# Patient Record
Sex: Male | Born: 1970 | Race: White | Hispanic: No | Marital: Married | Smoking: Former smoker
Health system: Southern US, Community
[De-identification: ages and names within clinical notes are randomized; demographics above are authoritative.]

## PROBLEM LIST (undated history)

## (undated) DIAGNOSIS — Z87442 Personal history of urinary calculi: Secondary | ICD-10-CM

## (undated) DIAGNOSIS — N529 Male erectile dysfunction, unspecified: Secondary | ICD-10-CM

## (undated) DIAGNOSIS — E785 Hyperlipidemia, unspecified: Secondary | ICD-10-CM

## (undated) DIAGNOSIS — N200 Calculus of kidney: Secondary | ICD-10-CM

## (undated) DIAGNOSIS — M4800 Spinal stenosis, site unspecified: Secondary | ICD-10-CM

## (undated) DIAGNOSIS — E119 Type 2 diabetes mellitus without complications: Secondary | ICD-10-CM

## (undated) DIAGNOSIS — I10 Essential (primary) hypertension: Secondary | ICD-10-CM

## (undated) HISTORY — PX: HAND SURGERY: SHX662

## (undated) HISTORY — DX: Male erectile dysfunction, unspecified: N52.9

## (undated) HISTORY — PX: APPENDECTOMY: SHX54

## (undated) HISTORY — DX: Essential (primary) hypertension: I10

## (undated) HISTORY — DX: Hyperlipidemia, unspecified: E78.5

## (undated) HISTORY — PX: KIDNEY STONE SURGERY: SHX686

## (undated) HISTORY — DX: Type 2 diabetes mellitus without complications: E11.9

---

## 2004-02-01 HISTORY — PX: LASIK: SHX215

## 2005-05-02 ENCOUNTER — Ambulatory Visit: Payer: Self-pay | Admitting: Family Medicine

## 2010-07-20 ENCOUNTER — Ambulatory Visit (HOSPITAL_BASED_OUTPATIENT_CLINIC_OR_DEPARTMENT_OTHER)
Admission: RE | Admit: 2010-07-20 | Discharge: 2010-07-20 | Disposition: A | Discharge status: Home / Self Care | Payer: Worker's Compensation | Source: Ambulatory Visit | Attending: Orthopedic Surgery | Admitting: Orthopedic Surgery

## 2010-07-20 DIAGNOSIS — Z794 Long term (current) use of insulin: Secondary | ICD-10-CM | POA: Insufficient documentation

## 2010-07-20 DIAGNOSIS — Z1811 Retained magnetic metal fragments: Secondary | ICD-10-CM | POA: Insufficient documentation

## 2010-07-20 DIAGNOSIS — E119 Type 2 diabetes mellitus without complications: Secondary | ICD-10-CM | POA: Insufficient documentation

## 2010-07-20 DIAGNOSIS — S61209A Unspecified open wound of unspecified finger without damage to nail, initial encounter: Secondary | ICD-10-CM | POA: Insufficient documentation

## 2010-07-20 DIAGNOSIS — Y9269 Other specified industrial and construction area as the place of occurrence of the external cause: Secondary | ICD-10-CM | POA: Insufficient documentation

## 2010-07-20 DIAGNOSIS — W268XXA Contact with other sharp object(s), not elsewhere classified, initial encounter: Secondary | ICD-10-CM | POA: Insufficient documentation

## 2010-07-20 DIAGNOSIS — F17200 Nicotine dependence, unspecified, uncomplicated: Secondary | ICD-10-CM | POA: Insufficient documentation

## 2010-07-20 DIAGNOSIS — Y990 Civilian activity done for income or pay: Secondary | ICD-10-CM | POA: Insufficient documentation

## 2010-07-20 LAB — POCT I-STAT, CHEM 8
BUN: 16 mg/dL (ref 6–23)
Calcium, Ion: 1.23 mmol/L (ref 1.12–1.32)
Chloride: 109 mEq/L (ref 96–112)
Creatinine, Ser: 1 mg/dL (ref 0.50–1.35)
Glucose, Bld: 141 mg/dL — ABNORMAL HIGH (ref 70–99)
TCO2: 22 mmol/L (ref 0–100)

## 2010-08-02 NOTE — H&P (Signed)
NAME:  Johnathan Potts, Johnathan Potts NO.:  [id number]  MEDICAL RECORD NO.:  [id number]  LOCATION:                                 FACILITY:  PHYSICIAN:  Betha Loa, MD             DATE OF BIRTH:  DATE OF ADMISSION:  07/20/2010 DATE OF DISCHARGE:                             HISTORY & PHYSICAL   CHIEF COMPLAINT:  Foreign body, left long finger MP joint.  HISTORY:  Mr. 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  He states earlier today while he was working on exhaust system, a piece of metal mass poked into his left long finger on the dorsum of the hand.  His hand was in a fist at that time.  He initially had difficulty moving his finger, but since unable to get it moving.  He noted a small wound on the dorsum of the finger. He presented to Urgent Care Facility where radiographs were taken revealing a small radiopaque foreign body in the MP joint of the long finger.  I was consulted for management of injury.  He reports no other injuries at this time.  No previous injuries.  ALLERGIES:  No known drug allergies.  PAST MEDICAL HISTORY:  Diabetes, hypertension, hypercholesterolemia.  PAST SURGICAL HISTORY:  Appendectomy and kidney stone, laser surgery.  MEDICATIONS:  Metformin, Lantus, simvastatin, fenofibrate, and lisinopril.  SOCIAL HISTORY:  Mr. Mutschler works as a Curator for [employer].  He smokes less than one pack per day x20 years and drinks approximately 2 shots per day.  FAMILY HISTORY:  Positive for diabetes.  REVIEW OF SYSTEMS:  A 13-point review of systems is negative with the exception of having recent cold.  PHYSICAL EXAMINATION:  GENERAL:  Alert and oriented x3, well developed, well nourished. HEAD:  Normocephalic, atraumatic. NECK:  Supple full range of motion. CHEST:  Regular rate rhythm. LUNGS:  Clear to auscultation bilaterally. ABDOMEN:  Nontender, nondistended. EXTREMITIES:  Right upper  extremity is intact to sensation and capillary refill in all fingertips.  He can flex and extend the IP joint of thumb and cross his fingers.  He has full range of motion.  No tenderness to palpation.  The left upper extremity, he has intact sensation and capillary refill in all fingertips.  He can flex and extend the IP joint of thumb and cross his fingers.  He can fully extend the digits.  When making a fist, he has discomfort in the dorsum of the long finger.  There is a small poke wound distal to the MP joint.  There is no erythema.  No drainage.  No swelling.  No ecchymosis.  He is tender to palpation around the poke wound and near the MP joint.  He has grease on his hands.  RADIOGRAPHS:  AP, lateral, and oblique view was taken at Urgent Care showing small, thin radiopaque foreign body that appears to be within the MP joint of the long finger.  No other fractures, dislocations, or radiopaque foreign bodies are noted.  ASSESSMENT AND PLAN:  Left long finger metacarpophalangeal joint, foreign body.  I  discussed with Mr. Bard the nature of his injury.  I recommended going to the operating room for removal of the foreign body from the joint, irrigation and debridement of the joint.  The risks, benefits, and alternatives of surgery were discussed including risk of blood loss, infection, damage to nerves, vessels, tendons, ligaments, bone, failure of procedure, need for additional procedure, complications with wound healing, continued pain, potential infection and need for repeat irrigation and debridement.  He voiced understanding of these risks and elected to proceed.  We will have the surgery arranged for as soon as the room is available.     Betha Loa, MD     KK/MEDQ  D:  07/20/2010  T:  07/21/2010  Job:  [id number]  Electronically Signed by Betha Loa  on 08/02/2010 05:27:37 PM

## 2010-08-02 NOTE — Op Note (Signed)
NAME:  Johnathan Potts, Johnathan Potts NO.:  [id number]  MEDICAL RECORD NO.:  [id number]  LOCATION:                                 FACILITY:  PHYSICIAN:  Betha Loa, MD        DATE OF BIRTH:  Jan 17, 1971  DATE OF PROCEDURE:  07/20/2010 DATE OF DISCHARGE:                              OPERATIVE REPORT   PREOPERATIVE DIAGNOSIS:  Left long finger MP joint, foreign body.  POSTOPERATIVE DIAGNOSIS:  Left long finger MP joint, foreign body.  PROCEDURE:  Left long finger MP joint removal foreign body and irrigation debridement of joint.  SURGEON:  Betha Loa, MD.  ASSISTANTS:  None.  ANESTHESIA:  General.  INTRAVENOUS FLUIDS:  As per anesthesia flow sheet.  ESTIMATED BLOOD LOSS:  Minimal.  COMPLICATIONS:  None.  SPECIMENS:  None.  TOURNIQUET TIME:  Thirty-two minutes.  DISPOSITION:  Stable to PACU.  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  He had pain and inability to range of motion his finger very well initially.  He was then able to move his finger. He went to an urgent care facility, where radiographs were taken revealing a radiopaque foreign body in the MP joint.  I was consulted for management of injury.  On examination, he had intact sensation capillary refill in the fingertips.  There was a small poke wound on the dorsum of the long finger.  There is no erythema, swelling, drainage or ecchymosis.  He was able to perform range of motion of the finger.  On radiographs, he had a small radiopaque foreign body in the MP joint of the left long finger.  No other fractures, dislocations or radiopaque foreign bodies were noted.  I recommended Johnathan Potts going to the operating room for irrigation debridement of the joint and removal of the foreign body.  Risks, benefits,  alternative of the surgery were discussed including risk of blood loss, infection, damage to nerves, vessels, tendons, ligaments bone, fair surgery, need for additional surgery, complications with wound healing, continued pain, retained foreign body and infection.  He voices understanding his risks and elected to proceed.  His tetanus has been updated within the last 2 years he states.  OPERATIVE COURSE:  After being identified preoperatively by myself, the patient and I agreed upon procedure and site of the procedure.  The surgery site was marked.  The risks, benefits and alternatives of the surgery were reviewed and he wished to proceed.  Surgical consent had been signed.  He was transferred to the operating room, placed in the operating table in supine position with the left upper extremity on arm board.  General anesthesia was induced by the anesthesiologist.  Left upper extremity prepped and draped in normal sterile orthopedic fashion. A surgical pause was performed between surgeons, Anesthesia and operating room staff and all were in agreement with  the patient's procedure and site of the procedure.  Tourniquet at the proximal aspect of the extremity  was inflated to 250 mmHg after exsanguination of limb with an Esmarch bandage.  Incision was made dorsally over the MP joint long finger.  This carried into subcutaneous tissues.  The metallic foreign body was noted at the ulnar side of the tendon piercing the sagittal bands.  It was removed.  There was some remaining gross contamination that appeared like small bits of metal.  These were debrided with the pickups and RayTec sponge.  The wound was copiously irrigated with sterile saline.  A rent was made in the ulnar side of the extensor mechanism proximal to the sagittal bands.  The undersurface of the tendon was explored.  There was small bits of metal shaving and here as well. These were removed again using the pickups and the  Ray-Tec sponge.  This area was irrigated.  The capsule was incised and the joint entered. There was no gross contamination within the joint.  I did not see any damage to the articular surface from the foreign body.  Looks distally at the ulnar side of the proximal phalanx and did not see an area where the foreign body had pierced the bone.  The joint was copiously irrigated with 400 mL of sterile saline by angiocatheter.  The wound was again copiously irrigated with sterile saline  and all 800 mL of sterile saline was used.  Two vessel loop drains were placed in the joint to allow for any necessary drainage.  A 4-0 Mersilene was used to repair proximally and distally.  The rent made in the sagittal band while leaving enough space open for the drains to come through allowing drainage.  The skin was repaired using 4-0 nylon suture in a horizontal mattress fashion leaving the central portion open for the drains.  The wound was injected with 4 mL of 0.25% plain Marcaine to aid in postoperative analgesia.  The wound was then dressed with sterile Xeroform, 4x4s and wrapped with Kling and Coban dressing lightly.  A Alumafoam splint was placed with the MP joint flexed approximately 10 degrees in the PIP joint in extension.  This was wrapped with a Coban lightly.  The tourniquet is deflated at 33 minutes.  The fingertips were pink with brisk capillary refill after deflation of tourniquet. Operative drapes were broken down.  The patient was awoken from anesthesia safely.  Transferred back to stretcher and taken to PACU in stable condition.  I will see him back in the office in 3 days for postoperative followup.  We will give him Percocet 5/325 1-2 p.o. q.6 h. p.r.n. pain dispensed #14 and Bactrim DS one p.o. b.i.d. x14 days as he is diabetic.     Betha Loa, MD     KK/MEDQ  D:  07/20/2010  T:  07/21/2010  Job:  [id number]  Electronically Signed by Betha Loa  on 08/02/2010 05:26:08 PM

## 2019-02-26 NOTE — Progress Notes (Addendum)
[facility] - Clinic Note  03/11/2019     CHIEF COMPLAINT Patient presents for Diabetic Eye Exam   HISTORY OF PRESENT ILLNESS: Johnathan Potts is a 49 y.o. male who presents to the clinic today for:   HPI    Diabetic Eye Exam    Vision is blurred for near.  Associated Symptoms Floaters.  Negative for Flashes, Distortion, Blind Spot, Pain, Redness, Photophobia, Glare, Trauma, Scalp Tenderness, Jaw Claudication, Shoulder/Hip pain, Fever, Weight Loss and Fatigue.  Diabetes characteristics include Type 2.  This started 12 years ago.  Blood sugar level is controlled.  Last Blood Glucose: Hasn't checked recently.  Last A1C 6.2 (History of a1c 11, around 6-12 months ago).  I, the attending physician,  performed the HPI with the patient and updated documentation appropriately.          Comments    Patient referred by Dr. Blackwood for diabetic retinal evaluation. Patient states BS now under good control. Last a1c was 6.2, which was down from 11. A1c was 11 about 6-12 months ago. Has floaters OD. Floaters are not new. Patient denies flashes. Had LASIK OU at [facility] about 15 years ago.       Last edited by Zamora, Brian, MD on 03/11/2019  9:43 AM. (History)    pt is here on the referral of his PCP, Dr. Blackwood, for a DM exam, pts last A1c was 6.2, he states he had a Dexcom monitor with his last insurance and he was able to get his blood sugar under control pretty quickly, he states he is trying to get a new one though [employer], but he is having a hard time, pt had lasik with Dr. Stonecipher about 15 years ago  Referring physician: Blackwood, Hillary, NP [address] [place],  [place] [address]  HISTORICAL INFORMATION:   Selected notes from the medical record:  Diabetic Eval Referred by Dr. Blackwood   CURRENT MEDICATIONS: No current outpatient medications on file. (Ophthalmic Drugs)   No current facility-administered medications for this visit.  (Ophthalmic Drugs)   Current Outpatient Medications (Other)  Medication Sig  . betamethasone dipropionate 0.05 % cream Apply 1 application topically as needed.  . Blood Glucose Monitoring Suppl (ONE TOUCH ULTRA 2) w/Device KIT as directed.  . Continuous Blood Gluc Receiver (DEXCOM G6 RECEIVER) DEVI as directed.  . Continuous Blood Gluc Sensor (DEXCOM G6 SENSOR) MISC as directed.  . Continuous Blood Gluc Transmit (DEXCOM G6 TRANSMITTER) MISC as directed.  . cyclobenzaprine (FLEXERIL) 10 MG tablet Take 10 mg by mouth 3 (three) times daily as needed.  . fenofibrate 160 MG tablet Take 160 mg by mouth daily.  . insulin NPH Human (HUMULIN N) 100 UNIT/ML injection Inject 15 Units into the skin as needed.  . lisinopril (ZESTRIL) 20 MG tablet Take 20 mg by mouth 2 (two) times daily.  . magnesium oxide (MAG-OX) 400 MG tablet Take 2 tablets by mouth 2 (two) times daily.  . Melatonin 3 MG CAPS Take 3 mg by mouth at bedtime.  . meloxicam (MOBIC) 15 MG tablet Take 15 mg by mouth daily.  . metFORMIN (GLUCOPHAGE-XR) 750 MG 24 hr tablet Take 750 mg by mouth 2 (two) times daily.  . Multiple Vitamin (MULTI-VITAMIN) tablet Take 1 tablet by mouth daily.  . omeprazole (PRILOSEC OTC) 20 MG tablet Take 20 mg by mouth every other day.  . OZEMPIC, 0.25 OR 0.5 MG/DOSE, 2 MG/1.5ML SOPN Inject 1 Units into the skin once a week.  .   simvastatin (ZOCOR) 20 MG tablet Take 20 mg by mouth at bedtime.   No current facility-administered medications for this visit. (Other)      REVIEW OF SYSTEMS: ROS    Positive for: Endocrine, Eyes   Negative for: Constitutional, Gastrointestinal, Neurological, Skin, Genitourinary, Musculoskeletal, HENT, Cardiovascular, Respiratory, Psychiatric, Allergic/Imm, Heme/Lymph   Last edited by Barber, Daryl D, COT on 03/11/2019  8:27 AM. (History)       ALLERGIES No Known Allergies  PAST MEDICAL HISTORY Past Medical History:  Diagnosis Date  . Diabetes mellitus without complication  (HCC)   . Hypertension    Past Surgical History:  Procedure Laterality Date  . LASIK Bilateral 2006   [facility]    FAMILY HISTORY Family History  Problem Relation Age of Onset  . Diabetes Mother   . Diabetes Father   . Diabetes Brother     SOCIAL HISTORY Social History   Tobacco Use  . Smoking status: Current Every Day Smoker    Packs/day: 1.00    Years: 35.00    Pack years: 35.00    Types: Cigarettes  . Smokeless tobacco: Never Used  Substance Use Topics  . Alcohol use: Yes    Comment: social  . Drug use: Never         OPHTHALMIC EXAM:  Base Eye Exam    Visual Acuity (Snellen - Linear)      Right Left   Dist [place] 20/25 -1 20/30 -1   Dist ph [place] NI 20/20       Tonometry (Tonopen, 8:41 AM)      Right Left   Pressure 16 17       Pupils      Dark Light Shape React APD   Right 3 2 Round Brisk None   Left 3 2 Round Brisk None       Visual Fields (Counting fingers)      Left Right    Full Full       Extraocular Movement      Right Left    Full, Ortho Full, Ortho       Neuro/Psych    Oriented x3: Yes   Mood/Affect: Normal        Slit Lamp and Fundus Exam    Slit Lamp Exam      Right Left   Lids/Lashes Dermatochalasis - upper lid, Meibomian gland dysfunction Dermatochalasis - upper lid, Meibomian gland dysfunction   Conjunctiva/Sclera White and quiet White and quiet   Cornea Trace Punctate epithelial erosions, scattered focal scars, well healed lasik flap Trace Punctate epithelial erosions, well healed lasik flap   Anterior Chamber Deep and quiet Deep and quiet   Iris Round and dilated, No NVI Round and dilated, No NVI   Lens 1-2+ Nuclear sclerosis, 1-2+ Cortical cataract 1-2+ Nuclear sclerosis, 2+ Cortical cataract   Vitreous Dense Asteroid hyalosis clear       Fundus Exam      Right Left   Disc Pink and Sharp Pink and Sharp, mild tilt   C/D Ratio 0.2 0.3   Macula hazy view, grossly flat, scattered MA, +Cystic changes Flat,  Blunted foveal reflex, +Microaneurysms/DBH   Vessels Mild Vascular attenuation Vascular attenuation, mild Tortuousity, mild AV crossing changes   Periphery Attached, scattered 360 IRH/DBH greatest posteriorly, focal CWS nasal to disc Attached, scattered 360 IRH/DBH          Refraction    Manifest Refraction      Sphere Cylinder Axis Dist VA     Right [place] +0.50 020 20/30   Left -0.75 +0.50 082 20/25+2          IMAGING AND PROCEDURES  Imaging and Procedures for _0 @  OCT, Retina - OU - Both Eyes       Right Eye Quality was good. Central Foveal Thickness: 271. Progression has no prior data. Findings include abnormal foveal contour, intraretinal fluid, no SRF.   Left Eye Quality was good. Central Foveal Thickness: 267. Progression has no prior data. Findings include normal foveal contour, no IRF, no SRF.   Notes *Images captured and stored on drive  Diagnosis / Impression:  OD: abnormal foveal contour, +IRF, no SRF OS: NFP, no IRF/SRF  Clinical management:  See below  Abbreviations: NFP - Normal foveal profile. CME - cystoid macular edema. PED - pigment epithelial detachment. IRF - intraretinal fluid. SRF - subretinal fluid. EZ - ellipsoid zone. ERM - epiretinal membrane. ORA - outer retinal atrophy. ORT - outer retinal tubulation. SRHM - subretinal hyper-reflective material        Fluorescein Angiography Optos (Transit OD)       Right Eye   Progression has no prior data. Early phase findings include microaneurysm, blockage (Focal blockage from asteroid). Mid/Late phase findings include leakage (focal blockage from asteroid; late leaking MA 360).   Left Eye   Progression has no prior data. Early phase findings include microaneurysm. Mid/Late phase findings include microaneurysm, leakage.   Notes **Images stored on drive**  Impression: Moderate to severe NPDR OU Late leaking MA's OU No NV OU OD: scattered punctate blockage from asteroid hyalosis                   ASSESSMENT/PLAN:    ICD-10-CM   1. 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 Moderate nonproliferative diabetic retinopathy of left eye without macular edema associated with type 2 diabetes mellitus ([place])  U20.2542   3. Retinal edema  H35.81 OCT, Retina - OU - Both Eyes  4. Essential hypertension  I10   5. 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 Combined forms of age-related cataract of both eyes  H25.813     1-3. Moderate nonproliferative diabetic retinopathy OU  - OD w/ mild DME; OS without DME  - The incidence, risk factors for progression, natural history and treatment options for diabetic retinopathy were discussed with patient.    - The need for close monitoring of blood glucose, blood pressure, and serum lipids, avoiding cigarette or any type of tobacco, and the need for long term follow up was also discussed with patient.  - exam shows scattered MA and IRH/DBH OU  - FA (02.08.21) shows late leaking MA 360 OU; no NV OU  - OCT w/ mild IRF/DME OD, OS without DME  - discussed findings, prognosis  - no intervention recommended at this time -- recommend monitoring for now  - f/u in 2-3 mos -- DFE/OCT  4,5. Hypertensive retinopathy OU  - discussed importance of tight BP control  - monitor  6. Asteroid hyalosis OD  - discussed findings, prognosis  - monitor  7. Mixed form age related cataract OU  - The symptoms of cataract, surgical options, and treatments and risks were discussed with patient.  - discussed diagnosis and progression  - not yet visually significant  - monitor for now  Ophthalmic Meds Ordered this visit:  No orders of the defined types were placed in this encounter.      Return for f/u 2-3 months, NPDR OU, DFE, OCT.  There are no Patient  Instructions on file for this visit.   Explained the diagnoses, plan, and follow up with the patient and they expressed understanding.  Patient expressed understanding of the importance of proper follow up care.   This document serves as a record of services personally performed by Gardiner Sleeper, MD, PhD. It was created on their behalf by Leeann Must, [place], a certified ophthalmic assistant. The creation of this record is the provider's dictation and/or activities during the visit.    Electronically signed by: Leeann Must, COA _0 @ 1:24 PM   This document serves as a record of services personally performed by Gardiner Sleeper, MD, PhD. It was created on their behalf by Ernest Mallick, OA, an ophthalmic assistant. The creation of this record is the provider's dictation and/or activities during the visit.    Electronically signed by: Ernest Mallick, OA 02.08.2021 1:24 PM    Gardiner Sleeper, M.D., Ph.D. Diseases & Surgery of the Retina and Vitreous [facility]  I have reviewed the above documentation for accuracy and completeness, and I agree with the above. Gardiner Sleeper, M.D., Ph.D. 03/11/19 1:24 PM   Abbreviations: M myopia (nearsighted); A astigmatism; H hyperopia (farsighted); P presbyopia; Mrx spectacle prescription;  CTL contact lenses; OD right eye; OS left eye; OU both eyes  XT exotropia; ET esotropia; PEK punctate epithelial keratitis; PEE punctate epithelial erosions; DES dry eye syndrome; MGD meibomian gland dysfunction; ATs artificial tears; PFAT's preservative free artificial tears; [place] nuclear sclerotic cataract; PSC posterior subcapsular cataract; ERM epi-retinal membrane; PVD posterior vitreous detachment; RD retinal detachment; DM diabetes mellitus; DR diabetic retinopathy; NPDR non-proliferative diabetic retinopathy; PDR proliferative diabetic retinopathy; CSME clinically significant macular edema; DME diabetic macular edema; dbh dot blot  hemorrhages; CWS cotton wool spot; POAG primary open angle glaucoma; C/D cup-to-disc ratio; HVF humphrey visual field; GVF goldmann visual field; OCT optical coherence tomography; IOP intraocular pressure; BRVO Branch retinal vein occlusion; CRVO central retinal vein occlusion; CRAO central retinal artery occlusion; BRAO branch retinal artery occlusion; RT retinal tear; SB scleral buckle; PPV pars plana vitrectomy; VH Vitreous hemorrhage; PRP panretinal laser photocoagulation; IVK intravitreal kenalog; VMT vitreomacular traction; MH Macular hole;  NVD neovascularization of the disc; NVE neovascularization elsewhere; AREDS age related eye disease study; ARMD age related macular degeneration; POAG primary open angle glaucoma; EBMD epithelial/anterior basement membrane dystrophy; ACIOL anterior chamber intraocular lens; IOL intraocular lens; PCIOL posterior chamber intraocular lens; Phaco/IOL phacoemulsification with intraocular lens placement; [place] photorefractive keratectomy; LASIK laser assisted in situ keratomileusis; HTN hypertension; DM diabetes mellitus; COPD chronic obstructive pulmonary disease

## 2019-03-11 ENCOUNTER — Encounter (INDEPENDENT_AMBULATORY_CARE_PROVIDER_SITE_OTHER): Payer: Self-pay | Admitting: Ophthalmology

## 2019-03-11 ENCOUNTER — Ambulatory Visit (INDEPENDENT_AMBULATORY_CARE_PROVIDER_SITE_OTHER): Payer: BC Managed Care – PPO | Admitting: Ophthalmology

## 2019-03-11 DIAGNOSIS — E113392 Type 2 diabetes mellitus with moderate nonproliferative diabetic retinopathy without macular edema, left eye: Secondary | ICD-10-CM

## 2019-03-11 DIAGNOSIS — H35033 Hypertensive retinopathy, bilateral: Secondary | ICD-10-CM

## 2019-03-11 DIAGNOSIS — E113311 Type 2 diabetes mellitus with moderate nonproliferative diabetic retinopathy with macular edema, right eye: Secondary | ICD-10-CM

## 2019-03-11 DIAGNOSIS — H3581 Retinal edema: Secondary | ICD-10-CM | POA: Diagnosis not present

## 2019-03-11 DIAGNOSIS — H4321 Crystalline deposits in vitreous body, right eye: Secondary | ICD-10-CM

## 2019-03-11 DIAGNOSIS — I10 Essential (primary) hypertension: Secondary | ICD-10-CM | POA: Diagnosis not present

## 2019-03-11 DIAGNOSIS — H25813 Combined forms of age-related cataract, bilateral: Secondary | ICD-10-CM

## 2019-06-10 ENCOUNTER — Encounter (INDEPENDENT_AMBULATORY_CARE_PROVIDER_SITE_OTHER): Payer: BC Managed Care – PPO | Admitting: Ophthalmology

## 2019-06-10 DIAGNOSIS — H4321 Crystalline deposits in vitreous body, right eye: Secondary | ICD-10-CM

## 2019-06-10 DIAGNOSIS — I10 Essential (primary) hypertension: Secondary | ICD-10-CM

## 2019-06-10 DIAGNOSIS — E113392 Type 2 diabetes mellitus with moderate nonproliferative diabetic retinopathy without macular edema, left eye: Secondary | ICD-10-CM

## 2019-06-10 DIAGNOSIS — E113311 Type 2 diabetes mellitus with moderate nonproliferative diabetic retinopathy with macular edema, right eye: Secondary | ICD-10-CM

## 2019-06-10 DIAGNOSIS — H35033 Hypertensive retinopathy, bilateral: Secondary | ICD-10-CM

## 2019-06-10 DIAGNOSIS — H3581 Retinal edema: Secondary | ICD-10-CM

## 2019-06-10 DIAGNOSIS — H25813 Combined forms of age-related cataract, bilateral: Secondary | ICD-10-CM

## 2019-08-09 NOTE — Progress Notes (Signed)
[facility] Note  08/12/2019     CHIEF COMPLAINT Patient presents for Retina Follow Up   HISTORY OF PRESENT ILLNESS: Johnathan Potts is a 49 y.o. male who presents to the clinic today for:   HPI    Retina Follow Up    Patient presents with  Diabetic Retinopathy.  In both eyes.  This started weeks ago.  Severity is moderate.  Duration of weeks.  Since onset it is stable.  I, the attending physician,  performed the HPI with the patient and updated documentation appropriately.          Comments    A1c:6.4 BS:119 this AM Pt states his computer has been a little blurry in the last week--attributes this to sleeping on his belly and potentially pressing on the eye when he sleeps.  Patient denies eye pain or discomfort.  Pt states he only has floaters when in swimming pool.  Denies fol.       Last edited by Bernarda Caffey, MD on 08/12/2019 12:29 PM. (History)    pt states his blood sugar is doing well, he states his A1c is 6.4, but he is trying to get his blood pressure under control, he states he is only on metformin right now   Referring physician: Juluis Pitch, MD [address],  [place] [id number]  HISTORICAL INFORMATION:   Selected notes from the MEDICAL RECORD NUMBER Diabetic Eval Referred by Dr. Pasty Arch   CURRENT MEDICATIONS: No current outpatient medications on file. (Ophthalmic Drugs)   No current facility-administered medications for this visit. (Ophthalmic Drugs)   Current Outpatient Medications (Other)  Medication Sig   betamethasone dipropionate 0.05 % cream Apply 1 application topically as needed.   Blood Glucose Monitoring Suppl (ONE TOUCH ULTRA 2) w/Device KIT as directed.   Continuous Blood Gluc Receiver ([place]) DEVI as directed.   Continuous Blood Gluc Sensor (DEXCOM G6 SENSOR) MISC as directed.   Continuous Blood Gluc Transmit (DEXCOM G6 TRANSMITTER) MISC as directed.   cyclobenzaprine (FLEXERIL) 10 MG  tablet Take 10 mg by mouth 3 (three) times daily as needed.   fenofibrate 160 MG tablet Take 160 mg by mouth daily.   insulin NPH Human (HUMULIN N) 100 UNIT/ML injection Inject 15 Units into the skin as needed.   lisinopril (ZESTRIL) 20 MG tablet Take 20 mg by mouth 2 (two) times daily.   magnesium oxide (MAG-OX) 400 MG tablet Take 2 tablets by mouth 2 (two) times daily.   Melatonin 3 MG CAPS Take 3 mg by mouth at bedtime.   meloxicam (MOBIC) 15 MG tablet Take 15 mg by mouth daily.   metFORMIN (GLUCOPHAGE-XR) 750 MG 24 hr tablet Take 750 mg by mouth 2 (two) times daily.   Multiple Vitamin (MULTI-VITAMIN) tablet Take 1 tablet by mouth daily.   omeprazole (PRILOSEC OTC) 20 MG tablet Take 20 mg by mouth every other day.   OZEMPIC, 0.25 OR 0.5 MG/DOSE, 2 MG/1.5ML SOPN Inject 1 Units into the skin once a week. (Patient not taking: Reported on 08/12/2019)   simvastatin (ZOCOR) 20 MG tablet Take 20 mg by mouth at bedtime.   No current facility-administered medications for this visit. (Other)      REVIEW OF SYSTEMS: ROS    Positive for: Endocrine, Eyes   Negative for: Constitutional, Gastrointestinal, Neurological, Skin, Genitourinary, Musculoskeletal, HENT, Cardiovascular, Respiratory, Psychiatric, Allergic/Imm, Heme/Lymph   Last edited by Doneen Poisson on 08/12/2019  8:25 AM. (History)  ALLERGIES No Known Allergies  PAST MEDICAL HISTORY Past Medical History:  Diagnosis Date   Diabetes mellitus without complication ([place])    Hypertension    Past Surgical History:  Procedure Laterality Date   LASIK Bilateral 2006   [place]    FAMILY HISTORY Family History  Problem Relation Age of Onset   Diabetes Mother    Diabetes Father    Diabetes Brother     SOCIAL HISTORY Social History   Tobacco Use   Smoking status: Current Every Day Smoker    Packs/day: 1.00    Years: 35.00    Pack years: 35.00    Types: Cigarettes   Smokeless  tobacco: Never Used  Substance Use Topics   Alcohol use: Yes    Comment: social   Drug use: Never         OPHTHALMIC EXAM:  Base Eye Exam    Visual Acuity (Snellen - Linear)      Right Left   Dist [place] 20/20 -2 20/20 -1       Tonometry (Tonopen, 8:24 AM)      Right Left   Pressure 14 13       Pupils      Dark Light Shape React APD   Right 3 2 Round Brisk 0   Left 3 2 Round Brisk 0       Visual Fields      Left Right    Full Full       Extraocular Movement      Right Left    Full Full       Neuro/Psych    Oriented x3: Yes   Mood/Affect: Normal       Dilation    Both eyes: 1.0% Mydriacyl, 2.5% Phenylephrine @ 8:24 AM        Slit Lamp and Fundus Exam    Slit Lamp Exam      Right Left   Lids/Lashes Dermatochalasis - upper lid Dermatochalasis - upper lid   Conjunctiva/Sclera White and quiet White and quiet   Cornea scattered focal scars, well healed lasik flap, mild Debris in tear film, mild arcus well healed lasik flap, trace Debris in tear film   Anterior Chamber Deep and quiet Deep and quiet   Iris Round and dilated, No NVI Round and dilated, No NVI   Lens 1-2+ Nuclear sclerosis, 1-2+ Cortical cataract 1-2+ Nuclear sclerosis, 2+ Cortical cataract   Vitreous Dense Asteroid hyalosis clear       Fundus Exam      Right Left   Disc Pink and Sharp Pink and Sharp, mild tilt   C/D Ratio 0.2 0.3   Macula flat, blunted foveal reflex, scattered MA/DBH, interval improvement in Cystic changes Flat, Blunted foveal reflex, scattered Microaneurysms/IRH, no edema   Vessels Vascular attenuation, Tortuous Vascular attenuation, mild Tortuousity   Periphery Attached, scattered 360 IRH/DBH greatest posteriorly Attached, scattered 360 IRH/DBH            IMAGING AND PROCEDURES  Imaging and Procedures for '@TODAY'$ @  OCT, Retina - OU - Both Eyes       Right Eye Quality was good. Central Foveal Thickness: 269. Progression has improved. Findings include abnormal  foveal contour, no SRF, no IRF (Interval improvement in cystic changes).   Left Eye Quality was good. Central Foveal Thickness: 268. Progression has been stable. Findings include normal foveal contour, no IRF, no SRF.   Notes *Images captured and stored on drive  Diagnosis / Impression:  OD:  Interval improvement in cystic changes -- NFP; no IRF/SRF OS: NFP, no IRF/SRF  Clinical management:  See below  Abbreviations: NFP - Normal foveal profile. CME - cystoid macular edema. PED - pigment epithelial detachment. IRF - intraretinal fluid. SRF - subretinal fluid. EZ - ellipsoid zone. ERM - epiretinal membrane. ORA - outer retinal atrophy. ORT - outer retinal tubulation. SRHM - subretinal hyper-reflective material                 ASSESSMENT/PLAN:    ICD-10-CM   1. 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 Moderate nonproliferative diabetic retinopathy of left eye without macular edema associated with type 2 diabetes mellitus ([place])  B71.6967   3. Retinal edema  H35.81 OCT, Retina - OU - Both Eyes  4. Essential hypertension  I10   5. Hypertensive retinopathy of both eyes  H35.033   6. 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 Combined forms of age-related cataract of both eyes  H25.813     1-3. Moderate nonproliferative diabetic retinopathy OU  - A1c: 6.9 on 05.14.21  - exam shows scattered MA and IRH/DBH OU  - FA (02.08.21) shows late leaking MA 360 OU; no NV OU  - OCT today w/ interval improvement in IRF/DME OD, OS without DME  - discussed findings, prognosis  - no intervention recommended at this time -- recommend monitoring for now  - f/u in 6 months, DFE/OCT   4,5. Hypertensive retinopathy OU  - discussed importance of tight BP control  - monitor  6. Asteroid hyalosis OD  - discussed findings, prognosis  - monitor  7. Mixed Cataract OU - The symptoms of cataract,  surgical options, and treatments and risks were discussed with patient. - discussed diagnosis and progression - not yet visually significant - monitor for now    Ophthalmic Meds Ordered this visit:  No orders of the defined types were placed in this encounter.      Return in about 6 months (around 02/12/2020) for f/u NPDR OU, DFE, OCT.  There are no Patient Instructions on file for this visit.   Explained the diagnoses, plan, and follow up with the patient and they expressed understanding.  Patient expressed understanding of the importance of proper follow up care.   This document serves as a record of services personally performed by Gardiner Sleeper, MD, PhD. It was created on their behalf by Leonie Douglas, an ophthalmic technician. The creation of this record is the provider's dictation and/or activities during the visit.    Electronically signed by: Leonie Douglas COA, 08/12/19  1:03 PM   This document serves as a record of services personally performed by Gardiner Sleeper, MD, PhD. It was created on their behalf by San Jetty. Owens Shark, OA an ophthalmic technician. The creation of this record is the provider's dictation and/or activities during the visit.    Electronically signed by: San Jetty. Owens Shark, [place] 07.12.2021 1:03 PM  Gardiner Sleeper, M.D., Ph.D. Diseases & Surgery of the Retina and Vitreous [facility]  I have reviewed the above documentation for accuracy and completeness, and I agree with the above. Gardiner Sleeper, M.D., Ph.D. 08/12/19 1:03 PM    Abbreviations: M myopia (nearsighted); A astigmatism; H hyperopia (farsighted); P presbyopia; Mrx spectacle prescription;  CTL contact lenses; OD right eye; OS left eye; OU both eyes  XT exotropia; ET esotropia; PEK  punctate epithelial keratitis; PEE punctate epithelial erosions; DES dry eye syndrome; MGD meibomian gland dysfunction; ATs artificial tears; PFAT's preservative free artificial tears; [place] nuclear  sclerotic cataract; PSC posterior subcapsular cataract; ERM epi-retinal membrane; PVD posterior vitreous detachment; RD retinal detachment; DM diabetes mellitus; DR diabetic retinopathy; NPDR non-proliferative diabetic retinopathy; PDR proliferative diabetic retinopathy; CSME clinically significant macular edema; DME diabetic macular edema; dbh dot blot hemorrhages; CWS cotton wool spot; POAG primary open angle glaucoma; C/D cup-to-disc ratio; HVF humphrey visual field; GVF goldmann visual field; OCT optical coherence tomography; IOP intraocular pressure; BRVO Branch retinal vein occlusion; CRVO central retinal vein occlusion; CRAO central retinal artery occlusion; BRAO branch retinal artery occlusion; RT retinal tear; SB scleral buckle; PPV pars plana vitrectomy; VH Vitreous hemorrhage; PRP panretinal laser photocoagulation; IVK intravitreal kenalog; VMT vitreomacular traction; MH Macular hole;  NVD neovascularization of the disc; NVE neovascularization elsewhere; AREDS age related eye disease study; ARMD age related macular degeneration; POAG primary open angle glaucoma; EBMD epithelial/anterior basement membrane dystrophy; ACIOL anterior chamber intraocular lens; IOL intraocular lens; PCIOL posterior chamber intraocular lens; Phaco/IOL phacoemulsification with intraocular lens placement; [place] photorefractive keratectomy; LASIK laser assisted in situ keratomileusis; HTN hypertension; DM diabetes mellitus; COPD chronic obstructive pulmonary disease

## 2019-08-12 ENCOUNTER — Other Ambulatory Visit: Payer: Self-pay

## 2019-08-12 ENCOUNTER — Encounter (INDEPENDENT_AMBULATORY_CARE_PROVIDER_SITE_OTHER): Payer: Self-pay | Admitting: Ophthalmology

## 2019-08-12 ENCOUNTER — Ambulatory Visit (INDEPENDENT_AMBULATORY_CARE_PROVIDER_SITE_OTHER): Payer: BC Managed Care – PPO | Admitting: Ophthalmology

## 2019-08-12 DIAGNOSIS — E113311 Type 2 diabetes mellitus with moderate nonproliferative diabetic retinopathy with macular edema, right eye: Secondary | ICD-10-CM | POA: Diagnosis not present

## 2019-08-12 DIAGNOSIS — H4321 Crystalline deposits in vitreous body, right eye: Secondary | ICD-10-CM

## 2019-08-12 DIAGNOSIS — E113392 Type 2 diabetes mellitus with moderate nonproliferative diabetic retinopathy without macular edema, left eye: Secondary | ICD-10-CM | POA: Diagnosis not present

## 2019-08-12 DIAGNOSIS — H35033 Hypertensive retinopathy, bilateral: Secondary | ICD-10-CM

## 2019-08-12 DIAGNOSIS — H3581 Retinal edema: Secondary | ICD-10-CM | POA: Diagnosis not present

## 2019-08-12 DIAGNOSIS — I10 Essential (primary) hypertension: Secondary | ICD-10-CM

## 2019-08-12 DIAGNOSIS — H25813 Combined forms of age-related cataract, bilateral: Secondary | ICD-10-CM

## 2019-10-01 ENCOUNTER — Other Ambulatory Visit: Payer: Self-pay | Admitting: Physical Medicine & Rehabilitation

## 2019-10-01 DIAGNOSIS — G8929 Other chronic pain: Secondary | ICD-10-CM

## 2019-10-19 ENCOUNTER — Ambulatory Visit: Payer: Self-pay

## 2019-12-17 ENCOUNTER — Other Ambulatory Visit: Payer: Self-pay | Admitting: Neurosurgery

## 2019-12-24 ENCOUNTER — Other Ambulatory Visit: Payer: Self-pay

## 2019-12-24 ENCOUNTER — Encounter
Admission: RE | Admit: 2019-12-24 | Discharge: 2019-12-24 | Disposition: A | Discharge status: Home / Self Care | Payer: BC Managed Care – PPO | Source: Ambulatory Visit | Attending: Neurosurgery | Admitting: Neurosurgery

## 2019-12-24 DIAGNOSIS — I10 Essential (primary) hypertension: Secondary | ICD-10-CM | POA: Insufficient documentation

## 2019-12-24 DIAGNOSIS — Z01818 Encounter for other preprocedural examination: Secondary | ICD-10-CM | POA: Insufficient documentation

## 2019-12-24 DIAGNOSIS — E119 Type 2 diabetes mellitus without complications: Secondary | ICD-10-CM | POA: Diagnosis not present

## 2019-12-24 HISTORY — DX: Personal history of urinary calculi: Z87.442

## 2019-12-24 LAB — TYPE AND SCREEN
ABO/RH(D): B POS
Antibody Screen: NEGATIVE

## 2019-12-24 LAB — URINALYSIS, ROUTINE W REFLEX MICROSCOPIC
Bilirubin Urine: NEGATIVE
Glucose, UA: NEGATIVE mg/dL
Hgb urine dipstick: NEGATIVE
Ketones, ur: NEGATIVE mg/dL
Leukocytes,Ua: NEGATIVE
Nitrite: NEGATIVE
Protein, ur: 30 mg/dL — AB
Specific Gravity, Urine: 1.024 (ref 1.005–1.030)
Squamous Epithelial / HPF: NONE SEEN (ref 0–5)
pH: 5 (ref 5.0–8.0)

## 2019-12-24 LAB — BASIC METABOLIC PANEL
Anion gap: 10 (ref 5–15)
BUN: 23 mg/dL — ABNORMAL HIGH (ref 6–20)
CO2: 23 mmol/L (ref 22–32)
Calcium: 9.4 mg/dL (ref 8.9–10.3)
Chloride: 103 mmol/L (ref 98–111)
Creatinine, Ser: 1.24 mg/dL (ref 0.61–1.24)
GFR, Estimated: >60 mL/min (ref >60)
Glucose, Bld: 186 mg/dL — ABNORMAL HIGH (ref 70–99)
Potassium: 4 mmol/L (ref 3.5–5.1)
Sodium: 136 mmol/L (ref 135–145)

## 2019-12-24 LAB — PROTIME-INR
INR: 0.9 (ref 0.8–1.2)
Prothrombin Time: 12.2 seconds (ref 11.4–15.2)

## 2019-12-24 LAB — CBC
HCT: 30.4 % — ABNORMAL LOW (ref 39.0–52.0)
Hemoglobin: 10.7 g/dL — ABNORMAL LOW (ref 13.0–17.0)
MCH: 33.5 pg (ref 26.0–34.0)
MCHC: 35.2 g/dL (ref 30.0–36.0)
MCV: 95.3 fL (ref 80.0–100.0)
Platelets: 248 10*3/uL (ref 150–400)
RBC: 3.19 MIL/uL — ABNORMAL LOW (ref 4.22–5.81)
RDW: 11.6 % (ref 11.5–15.5)
WBC: 5.8 10*3/uL (ref 4.0–10.5)
nRBC: 0 % (ref 0.0–0.2)

## 2019-12-24 LAB — SURGICAL PCR SCREEN
MRSA, PCR: NEGATIVE
Staphylococcus aureus: NEGATIVE

## 2019-12-24 LAB — APTT: aPTT: 39 seconds — ABNORMAL HIGH (ref 24–36)

## 2019-12-24 NOTE — Patient Instructions (Addendum)
Your procedure is scheduled on: 12/30/19- Monday Report to the Registration Desk on the 1st floor of the [facility]. To find out your arrival time, please call [phone] between 1PM - 3PM on: 12/27/19- Friday  REMEMBER: Instructions that are not followed completely may result in serious medical risk, up to and including death; or upon the discretion of your surgeon and anesthesiologist your surgery may need to be rescheduled.  Do not eat food after midnight the night before surgery.  No gum chewing, lozengers or hard candies.  You may however, drink CLEAR liquids up to 2 hours before you are scheduled to arrive for your surgery. Do not drink anything within 2 hours of your scheduled arrival time. Type 1 and Type 2 diabetics should only drink water.  TAKE THESE MEDICATIONS THE MORNING OF SURGERY WITH A SIP OF WATER: - amLODipine (NORVASC) 5 MG tablet - fenofibrate 160 MG tablet - HYDROcodone-acetaminophen (NORCO) 10-325 MG tablet  - omeprazole (PRILOSEC OTC) 20 MG tablet, (take one the night before and one on the morning of surgery - helps to prevent nausea after surgery.)  Stop metFORMIN (GLUCOPHAGE-XR) 750 MG 24 hr tablet  2 days prior to surgery. Do not take 11/27, 11.28, and 11/29.  Take 1/2 of usual insulin dose the night before surgery and none on the morning of surgery.  Follow recommendations from Cardiologist, Pulmonologist or PCP regarding stopping Aspirin, Coumadin, Plavix, Eliquis, Pradaxa, or Pletal. Stop on 12/22/19 per MD order.  One week prior to surgery: Stop Mobic 12/22/19. Stop Anti-inflammatories (NSAIDS) such as Advil, Aleve, Ibuprofen,Mobic,  Motrin, Naproxen, Naprosyn and Aspirin based products such as Excedrin, [place]ys Powder, BC Powder. Stop ANY OVER THE COUNTER supplements until after surgery. (However, you may continue taking Vitamin D, Vitamin B, and multivitamin up until the day before surgery.)  No Alcohol for 24 hours before or after surgery.  No  Smoking including e-cigarettes for 24 hours prior to surgery.  No chewable tobacco products for at least 6 hours prior to surgery.  No nicotine patches on the day of surgery.  Do not use any "recreational" drugs for at least a week prior to your surgery.  Please be advised that the combination of cocaine and anesthesia may have negative outcomes, up to and including death. If you test positive for cocaine, your surgery will be cancelled.  On the morning of surgery brush your teeth with toothpaste and water, you may rinse your mouth with mouthwash if you wish. Do not swallow any toothpaste or mouthwash.  Do not wear jewelry, make-up, hairpins, clips or nail polish.  Do not wear lotions, powders, or perfumes.   Do not shave body from the neck down 48 hours prior to surgery just in case you cut yourself which could leave a site for infection.  Also, freshly shaved skin may become irritated if using the CHG soap.  Contact lenses, hearing aids and dentures may not be worn into surgery.  Do not bring valuables to the hospital. [facility] is not responsible for any missing/lost belongings or valuables.   Use CHG Soap or wipes as directed on instruction sheet.  Notify your doctor if there is any change in your medical condition (cold, fever, infection).  Wear comfortable clothing (specific to your surgery type) to the hospital.  Plan for stool softeners for home use; pain medications have a tendency to cause constipation. You can also help prevent constipation by eating foods high in fiber such as fruits and vegetables and drinking plenty  of fluids as your diet allows.  After surgery, you can help prevent lung complications by doing breathing exercises.  Take deep breaths and cough every 1-2 hours. Your doctor may order a device called an Incentive Spirometer to help you take deep breaths. When coughing or sneezing, hold a pillow firmly against your incision with both hands. This is called  "splinting." Doing this helps protect your incision. It also decreases belly discomfort.  If you are being admitted to the hospital overnight, leave your suitcase in the car. After surgery it may be brought to your room.  If you are being discharged the day of surgery, you will not be allowed to drive home. You will need a responsible adult (18 years or older) to drive you home and stay with you that night.   If you are taking public transportation, you will need to have a responsible adult (18 years or older) with you. Please confirm with your physician that it is acceptable to use public transportation.   Please call the Pre-admissions Testing Dept. at [phone] if you have any questions about these instructions.  Visitation Policy:  Patients undergoing a surgery or procedure may have one family member or support person with them as long as that person is not COVID-19 positive or experiencing its symptoms.  That person may remain in the waiting area during the procedure.  Inpatient Visitation Update:   In an effort to ensure the safety of our team members and our patients, we are implementing a change to our visitation policy:  Effective Monday, Aug. 9, at 7 a.m., inpatients will be allowed one support person.  o The support person may change daily.  o The support person must pass our screening, gel in and out, and wear a mask at all times, including in the patient's room.  o Patients must also wear a mask when staff or their support person are in the room.  o Masking is required regardless of vaccination status.  Systemwide, no visitors 17 or younger.

## 2019-12-27 ENCOUNTER — Other Ambulatory Visit: Payer: Self-pay

## 2019-12-27 ENCOUNTER — Other Ambulatory Visit
Admission: RE | Admit: 2019-12-27 | Discharge: 2019-12-27 | Disposition: A | Discharge status: Home / Self Care | Payer: BC Managed Care – PPO | Source: Ambulatory Visit | Attending: Neurosurgery | Admitting: Neurosurgery

## 2019-12-27 DIAGNOSIS — Z79899 Other long term (current) drug therapy: Secondary | ICD-10-CM | POA: Diagnosis not present

## 2019-12-27 DIAGNOSIS — M5416 Radiculopathy, lumbar region: Secondary | ICD-10-CM | POA: Diagnosis not present

## 2019-12-27 DIAGNOSIS — Z20822 Contact with and (suspected) exposure to covid-19: Secondary | ICD-10-CM | POA: Diagnosis not present

## 2019-12-27 DIAGNOSIS — Z791 Long term (current) use of non-steroidal anti-inflammatories (NSAID): Secondary | ICD-10-CM | POA: Diagnosis not present

## 2019-12-27 DIAGNOSIS — M48061 Spinal stenosis, lumbar region without neurogenic claudication: Secondary | ICD-10-CM | POA: Diagnosis present

## 2019-12-27 DIAGNOSIS — F17200 Nicotine dependence, unspecified, uncomplicated: Secondary | ICD-10-CM | POA: Diagnosis not present

## 2019-12-27 DIAGNOSIS — Z01812 Encounter for preprocedural laboratory examination: Secondary | ICD-10-CM | POA: Insufficient documentation

## 2019-12-27 DIAGNOSIS — Z7982 Long term (current) use of aspirin: Secondary | ICD-10-CM | POA: Diagnosis not present

## 2019-12-27 DIAGNOSIS — Z794 Long term (current) use of insulin: Secondary | ICD-10-CM | POA: Diagnosis not present

## 2019-12-28 LAB — SARS CORONAVIRUS 2 (TAT 6-24 HRS): SARS Coronavirus 2: NEGATIVE

## 2019-12-30 ENCOUNTER — Ambulatory Visit: Payer: BC Managed Care – PPO | Admitting: Anesthesiology

## 2019-12-30 ENCOUNTER — Encounter
Admission: RE | Disposition: A | Discharge status: Home / Self Care | Payer: Self-pay | Source: Home / Self Care | Attending: Neurosurgery

## 2019-12-30 ENCOUNTER — Other Ambulatory Visit: Payer: Self-pay

## 2019-12-30 ENCOUNTER — Ambulatory Visit
Admission: RE | Admit: 2019-12-30 | Discharge: 2019-12-30 | Disposition: A | Discharge status: Home / Self Care | Payer: BC Managed Care – PPO | Attending: Neurosurgery | Admitting: Neurosurgery

## 2019-12-30 ENCOUNTER — Ambulatory Visit: Payer: BC Managed Care – PPO

## 2019-12-30 ENCOUNTER — Encounter: Payer: Self-pay | Admitting: Neurosurgery

## 2019-12-30 DIAGNOSIS — Z20822 Contact with and (suspected) exposure to covid-19: Secondary | ICD-10-CM | POA: Insufficient documentation

## 2019-12-30 DIAGNOSIS — M48061 Spinal stenosis, lumbar region without neurogenic claudication: Secondary | ICD-10-CM | POA: Insufficient documentation

## 2019-12-30 DIAGNOSIS — Z791 Long term (current) use of non-steroidal anti-inflammatories (NSAID): Secondary | ICD-10-CM | POA: Insufficient documentation

## 2019-12-30 DIAGNOSIS — Z79899 Other long term (current) drug therapy: Secondary | ICD-10-CM | POA: Insufficient documentation

## 2019-12-30 DIAGNOSIS — Z419 Encounter for procedure for purposes other than remedying health state, unspecified: Secondary | ICD-10-CM

## 2019-12-30 DIAGNOSIS — Z794 Long term (current) use of insulin: Secondary | ICD-10-CM | POA: Insufficient documentation

## 2019-12-30 DIAGNOSIS — Z7982 Long term (current) use of aspirin: Secondary | ICD-10-CM | POA: Insufficient documentation

## 2019-12-30 DIAGNOSIS — M5416 Radiculopathy, lumbar region: Secondary | ICD-10-CM | POA: Insufficient documentation

## 2019-12-30 DIAGNOSIS — F17200 Nicotine dependence, unspecified, uncomplicated: Secondary | ICD-10-CM | POA: Insufficient documentation

## 2019-12-30 HISTORY — PX: LUMBAR LAMINECTOMY/DECOMPRESSION MICRODISCECTOMY: SHX5026

## 2019-12-30 LAB — ABO/RH: ABO/RH(D): B POS

## 2019-12-30 LAB — GLUCOSE, CAPILLARY
Glucose-Capillary: 151 mg/dL — ABNORMAL HIGH (ref 70–99)
Glucose-Capillary: 161 mg/dL — ABNORMAL HIGH (ref 70–99)

## 2019-12-30 SURGERY — LUMBAR LAMINECTOMY/DECOMPRESSION MICRODISCECTOMY
Anesthesia: General

## 2019-12-30 MED ORDER — MIDAZOLAM HCL 2 MG/2ML IJ SOLN
INTRAMUSCULAR | Status: DC | PRN
  Administered 2019-12-30: 2 mg via INTRAVENOUS

## 2019-12-30 MED ORDER — LIDOCAINE HCL (CARDIAC) PF 100 MG/5ML IV SOSY
PREFILLED_SYRINGE | INTRAVENOUS | Status: DC | PRN
  Administered 2019-12-30: 80 mg via INTRAVENOUS

## 2019-12-30 MED ORDER — THROMBIN 5000 UNITS EX SOLR
CUTANEOUS | Status: DC | PRN
  Administered 2019-12-30: 5000 [IU] via TOPICAL

## 2019-12-30 MED ORDER — OXYCODONE HCL 5 MG/5ML PO SOLN: 5.0000 mg | Freq: Once | ORAL | Status: AC | PRN

## 2019-12-30 MED ORDER — SODIUM CHLORIDE 0.9 % IV SOLN
INTRAVENOUS | Status: DC | PRN
  Administered 2019-12-30: 40 mL

## 2019-12-30 MED ORDER — ONDANSETRON HCL 4 MG/2ML IJ SOLN: 4.0000 mg | Freq: Once | INTRAMUSCULAR | Status: DC | PRN

## 2019-12-30 MED ORDER — BUPIVACAINE HCL 0.5 % IJ SOLN
INTRAMUSCULAR | Status: DC | PRN
  Administered 2019-12-30: 5 mL

## 2019-12-30 MED ORDER — DEXAMETHASONE SODIUM PHOSPHATE 10 MG/ML IJ SOLN
INTRAMUSCULAR | Status: DC | PRN
  Administered 2019-12-30: 10 mg via INTRAVENOUS

## 2019-12-30 MED ORDER — ACETAMINOPHEN 10 MG/ML IV SOLN
INTRAVENOUS | Status: DC | PRN
  Administered 2019-12-30: 1000 mg via INTRAVENOUS

## 2019-12-30 MED ORDER — HYDROCODONE-ACETAMINOPHEN 5-325 MG PO TABS: 1.0000 | ORAL_TABLET | ORAL | 0 refills | Status: AC | PRN

## 2019-12-30 MED ORDER — CEFAZOLIN SODIUM-DEXTROSE 2-4 GM/100ML-% IV SOLN
2.0000 g | INTRAVENOUS | Status: AC
  Administered 2019-12-30: 2 g via INTRAVENOUS

## 2019-12-30 MED ORDER — OXYCODONE HCL 5 MG PO TABS
ORAL_TABLET | ORAL | Status: AC
  Filled 2019-12-30: qty 1

## 2019-12-30 MED ORDER — ACETAMINOPHEN 10 MG/ML IV SOLN
INTRAVENOUS | Status: AC
  Filled 2019-12-30: qty 100

## 2019-12-30 MED ORDER — ACETAMINOPHEN 10 MG/ML IV SOLN: 1000.0000 mg | Freq: Once | INTRAVENOUS | Status: DC | PRN

## 2019-12-30 MED ORDER — CHLORHEXIDINE GLUCONATE 0.12 % MT SOLN
15.0000 mL | Freq: Once | OROMUCOSAL | Status: AC
  Administered 2019-12-30: 15 mL via OROMUCOSAL

## 2019-12-30 MED ORDER — FENTANYL CITRATE (PF) 100 MCG/2ML IJ SOLN: 25.0000 ug | INTRAMUSCULAR | Status: DC | PRN

## 2019-12-30 MED ORDER — PROPOFOL 10 MG/ML IV BOLUS
INTRAVENOUS | Status: DC | PRN
  Administered 2019-12-30: 150 mg via INTRAVENOUS
  Administered 2019-12-30 (×2): 50 mg via INTRAVENOUS
  Administered 2019-12-30: 30 mg via INTRAVENOUS

## 2019-12-30 MED ORDER — BUPIVACAINE HCL (PF) 0.5 % IJ SOLN
INTRAMUSCULAR | Status: DC | PRN
  Administered 2019-12-30: 20 mL

## 2019-12-30 MED ORDER — FENTANYL CITRATE (PF) 100 MCG/2ML IJ SOLN
INTRAMUSCULAR | Status: DC | PRN
  Administered 2019-12-30: 50 ug via INTRAVENOUS
  Administered 2019-12-30: 100 ug via INTRAVENOUS

## 2019-12-30 MED ORDER — ORAL CARE MOUTH RINSE
15.0000 mL | Freq: Once | OROMUCOSAL | Status: AC
Start: 2019-12-30 — End: 2019-12-30

## 2019-12-30 MED ORDER — PROPOFOL 10 MG/ML IV BOLUS
INTRAVENOUS | Status: AC
  Filled 2019-12-30: qty 80

## 2019-12-30 MED ORDER — CYCLOBENZAPRINE HCL 10 MG PO TABS: 10.0000 mg | ORAL_TABLET | Freq: Three times a day (TID) | ORAL | 0 refills | Status: DC | PRN

## 2019-12-30 MED ORDER — MIDAZOLAM HCL 2 MG/2ML IJ SOLN
INTRAMUSCULAR | Status: AC
  Filled 2019-12-30: qty 2

## 2019-12-30 MED ORDER — SUCCINYLCHOLINE CHLORIDE 20 MG/ML IJ SOLN
INTRAMUSCULAR | Status: DC | PRN
  Administered 2019-12-30: 80 mg via INTRAVENOUS

## 2019-12-30 MED ORDER — REMIFENTANIL HCL 1 MG IV SOLR
INTRAVENOUS | Status: AC
  Filled 2019-12-30: qty 1000

## 2019-12-30 MED ORDER — METHYLPREDNISOLONE ACETATE 40 MG/ML IJ SUSP
INTRAMUSCULAR | Status: DC | PRN
  Administered 2019-12-30: 40 mg

## 2019-12-30 MED ORDER — ONDANSETRON HCL 4 MG/2ML IJ SOLN
INTRAMUSCULAR | Status: DC | PRN
  Administered 2019-12-30: 4 mg via INTRAVENOUS

## 2019-12-30 MED ORDER — SODIUM CHLORIDE 0.9 % IV SOLN: INTRAVENOUS | Status: DC

## 2019-12-30 MED ORDER — FENTANYL CITRATE (PF) 250 MCG/5ML IJ SOLN
INTRAMUSCULAR | Status: AC
  Filled 2019-12-30: qty 5

## 2019-12-30 MED ORDER — KETOROLAC TROMETHAMINE 30 MG/ML IJ SOLN
INTRAMUSCULAR | Status: DC | PRN
  Administered 2019-12-30: 30 mg via INTRAVENOUS

## 2019-12-30 MED ORDER — PHENYLEPHRINE HCL-NACL 10-0.9 MG/250ML-% IV SOLN
INTRAVENOUS | Status: DC | PRN
  Administered 2019-12-30: 25 ug/min via INTRAVENOUS

## 2019-12-30 MED ORDER — OXYCODONE HCL 5 MG PO TABS
5.0000 mg | ORAL_TABLET | Freq: Once | ORAL | Status: AC | PRN
  Administered 2019-12-30: 5 mg via ORAL

## 2019-12-30 MED ORDER — REMIFENTANIL HCL 1 MG IV SOLR
INTRAVENOUS | Status: DC | PRN
Start: 2019-12-30 — End: 2019-12-30
  Administered 2019-12-30: .1 ug/kg/min via INTRAVENOUS

## 2019-12-30 MED ORDER — GLYCOPYRROLATE 0.2 MG/ML IJ SOLN
INTRAMUSCULAR | Status: DC | PRN
  Administered 2019-12-30: .2 mg via INTRAVENOUS

## 2019-12-30 MED ORDER — PHENYLEPHRINE HCL (PRESSORS) 10 MG/ML IV SOLN
INTRAVENOUS | Status: DC | PRN
  Administered 2019-12-30 (×2): 100 ug via INTRAVENOUS

## 2019-12-30 MED ORDER — CHLORHEXIDINE GLUCONATE 0.12 % MT SOLN
OROMUCOSAL | Status: AC
  Filled 2019-12-30: qty 15

## 2019-12-30 MED ORDER — CEFAZOLIN SODIUM-DEXTROSE 2-4 GM/100ML-% IV SOLN
INTRAVENOUS | Status: AC
  Filled 2019-12-30: qty 100

## 2019-12-30 SURGICAL SUPPLY — 66 items
ADH SKN CLS APL DERMABOND .7 (GAUZE/BANDAGES/DRESSINGS) ×1
AGENT HMST MTR 8 SURGIFLO (HEMOSTASIS) ×1
APL PRP STRL LF DISP 70% ISPRP (MISCELLANEOUS) ×2
APL SRG 60D 8 XTD TIP BNDBL (TIP)
BUR NEURO DRILL SOFT 3.0X3.8M (BURR) ×3 IMPLANT
CANISTER SUCT 1200ML W/VALVE (MISCELLANEOUS) ×6 IMPLANT
CHLORAPREP W/TINT 26 (MISCELLANEOUS) ×6 IMPLANT
CNTNR SPEC 2.5X3XGRAD LEK (MISCELLANEOUS) ×1
CONT SPEC 4OZ STER OR WHT (MISCELLANEOUS) ×2
CONT SPEC 4OZ STRL OR WHT (MISCELLANEOUS) ×1
CONTAINER SPEC 2.5X3XGRAD LEK (MISCELLANEOUS) ×1 IMPLANT
COUNTER NEEDLE 20/40 LG (NEEDLE) ×3 IMPLANT
COVER LIGHT HANDLE STERIS (MISCELLANEOUS) ×6 IMPLANT
COVER WAND RF STERILE (DRAPES) ×3 IMPLANT
CUP MEDICINE 2OZ PLAST GRAD ST (MISCELLANEOUS) ×3 IMPLANT
DERMABOND ADVANCED (GAUZE/BANDAGES/DRESSINGS) ×2
DERMABOND ADVANCED .7 DNX12 (GAUZE/BANDAGES/DRESSINGS) ×1 IMPLANT
DRAPE C-ARM 42X72 X-RAY (DRAPES) ×6 IMPLANT
DRAPE LAPAROTOMY 100X77 ABD (DRAPES) ×3 IMPLANT
DRAPE MICROSCOPE SPINE 48X150 (DRAPES) ×3 IMPLANT
DRAPE SURG 17X11 SM STRL (DRAPES) ×3 IMPLANT
DRSG TEGADERM 4X4.75 (GAUZE/BANDAGES/DRESSINGS) IMPLANT
DRSG TELFA 4X3 1S NADH ST (GAUZE/BANDAGES/DRESSINGS) IMPLANT
DURASEAL APPLICATOR TIP (TIP) IMPLANT
DURASEAL SPINE SEALANT 3ML (MISCELLANEOUS) IMPLANT
ELECT CAUTERY BLADE TIP 2.5 (TIP) ×3
ELECT EZSTD 165MM 6.5IN (MISCELLANEOUS) ×3
ELECT REM PT RETURN 9FT ADLT (ELECTROSURGICAL) ×3
ELECTRODE CAUTERY BLDE TIP 2.5 (TIP) ×1 IMPLANT
ELECTRODE EZSTD 165MM 6.5IN (MISCELLANEOUS) ×1 IMPLANT
ELECTRODE REM PT RTRN 9FT ADLT (ELECTROSURGICAL) ×1 IMPLANT
GAUZE SPONGE 4X4 12PLY STRL (GAUZE/BANDAGES/DRESSINGS) ×3 IMPLANT
GLOVE BIOGEL PI IND STRL 7.0 (GLOVE) ×1 IMPLANT
GLOVE BIOGEL PI IND STRL 8 (GLOVE) ×3 IMPLANT
GLOVE BIOGEL PI INDICATOR 7.0 (GLOVE) ×2
GLOVE BIOGEL PI INDICATOR 8 (GLOVE) ×6
GLOVE SURG SYN 7.0 (GLOVE) ×6 IMPLANT
GLOVE SURG SYN 8.0 (GLOVE) ×3 IMPLANT
GOWN STRL REUS W/ TWL XL LVL3 (GOWN DISPOSABLE) ×2 IMPLANT
GOWN STRL REUS W/TWL XL LVL3 (GOWN DISPOSABLE) ×6
GRADUATE 1200CC STRL 31836 (MISCELLANEOUS) ×3 IMPLANT
IV CATH ANGIO 12GX3 LT BLUE (NEEDLE) ×3 IMPLANT
KIT TURNOVER KIT A (KITS) ×3 IMPLANT
KIT WILSON FRAME (KITS) ×3 IMPLANT
KNIFE BAYONET SHORT DISCETOMY (MISCELLANEOUS) ×3 IMPLANT
MANIFOLD NEPTUNE II (INSTRUMENTS) ×3 IMPLANT
MARKER SKIN DUAL TIP RULER LAB (MISCELLANEOUS) ×6 IMPLANT
NDL SAFETY ECLIPSE 18X1.5 (NEEDLE) ×1 IMPLANT
NEEDLE HYPO 18GX1.5 SHARP (NEEDLE) ×3
NEEDLE HYPO 22GX1.5 SAFETY (NEEDLE) ×3 IMPLANT
NS IRRIG 1000ML POUR BTL (IV SOLUTION) ×3 IMPLANT
PACK LAMINECTOMY NEURO (CUSTOM PROCEDURE TRAY) ×3 IMPLANT
PAD ARMBOARD 7.5X6 YLW CONV (MISCELLANEOUS) ×3 IMPLANT
SPOGE SURGIFLO 8M (HEMOSTASIS) ×2
SPONGE SURGIFLO 8M (HEMOSTASIS) ×1 IMPLANT
STAPLER SKIN PROX 35W (STAPLE) IMPLANT
SUT NURALON 4 0 TR CR/8 (SUTURE) IMPLANT
SUT POLYSORB 2-0 5X18 GS-10 (SUTURE) ×9 IMPLANT
SUT VIC AB 0 CT1 18XCR BRD 8 (SUTURE) ×2 IMPLANT
SUT VIC AB 0 CT1 8-18 (SUTURE) ×6
SYR 10ML LL (SYRINGE) ×6 IMPLANT
SYR 30ML LL (SYRINGE) ×6 IMPLANT
SYR 3ML LL SCALE MARK (SYRINGE) ×3 IMPLANT
TOWEL OR 17X26 4PK STRL BLUE (TOWEL DISPOSABLE) ×12 IMPLANT
TUBING CONNECTING 10 (TUBING) ×2 IMPLANT
TUBING CONNECTING 10' (TUBING) ×1

## 2019-12-30 NOTE — OR Nursing (Signed)
IV right hand not documented in epic - removed postop @ 10:58 am.

## 2019-12-30 NOTE — Discharge Instructions (Addendum)
NEUROSURGERY DISCHARGE INSTRUCTIONS            (wear back brace when you're up moving around) (scripts for both meds flexeril and norco sent to your pharmacy by Dr. Adriana Simas)  Admission diagnosis: Spinal stenosis of lumbar region without neurogenic claudication Lumbar radiculopathy  Operative procedure: L4/5 Laminectomy  What to do after you leave the hospital:  Recommended diet: regular diet. Increase protein intake to promote wound healing.  Recommended activity: activity as tolerated. Encourage walking multiple times per day. No driving if on pain medication  Special Instructions  No straining, no heavy lifting > 10lbs x 4 weeks.  Keep incision area clean and dry. Clean incision daily and keep dry at other times.  May Shower starting tomorrow.  No bath or soaking in tub for6weeks. You have absorbable sutures. Nothing to remove. Do not pick adhesive off. You may remove dressing at home and keep open to air   Please Report any of the following: Nausea or Vomiting, Temperature is greater than 101.85F (38.1C) degrees, Difficulty Breathing or Shortness of Breath, Inability to Eat, drink Fluids, or Take medications, Bleeding, swelling, or drainage from surgical incision sites, New numbness or weakness and Bowel or bladder dysfunction to our clinic at [phone] or the on call provider  Additional Follow up appointments Follow up in 2 weeks (12/14 @ 1145) in [place] clinic for wound evaluation  Please see below for scheduled appointments:  Future Appointments  Date Time Provider Department Center  02/17/2020  9:00 AM Rennis Chris, MD [facility] None    Bupivacaine Liposomal Suspension for Injection What is this medicine? BUPIVACAINE LIPOSOMAL (bue PIV a kane LIP oh som al) is an anesthetic. It causes loss of feeling in the skin or other tissues. It is used to prevent and to treat pain from some procedures. This medicine may be used for other purposes; ask your health care provider or  pharmacist if you have questions. COMMON BRAND NAME(S): EXPAREL What should I tell my health care provider before I take this medicine? They need to know if you have any of these conditions:  G6PD deficiency  heart disease  kidney disease  liver disease  low blood pressure  lung or breathing disease, like asthma  an unusual or allergic reaction to bupivacaine, other medicines, foods, dyes, or preservatives  pregnant or trying to get pregnant  breast-feeding How should I use this medicine? This medicine is for injection into the affected area. It is given by a health care professional in a hospital or clinic setting. Talk to your pediatrician regarding the use of this medicine in children. Special care may be needed. Overdosage: If you think you have taken too much of this medicine contact a poison control center or emergency room at once. NOTE: This medicine is only for you. Do not share this medicine with others. What if I miss a dose? This does not apply. What may interact with this medicine? This medicine may interact with the following medications:  acetaminophen  certain antibiotics like dapsone, nitrofurantoin, aminosalicylic acid, sulfonamides  certain medicines for seizures like phenobarbital, phenytoin, valproic acid  chloroquine  cyclophosphamide  flutamide  hydroxyurea  ifosfamide  metoclopramide  nitric oxide  nitroglycerin  nitroprusside  nitrous oxide  other local anesthetics like lidocaine, pramoxine, tetracaine  primaquine  quinine  rasburicase  sulfasalazine This list may not describe all possible interactions. Give your health care provider a list of all the medicines, herbs, non-prescription drugs, or dietary supplements you use. Also tell them  if you smoke, drink alcohol, or use illegal drugs. Some items may interact with your medicine. What should I watch for while using this medicine? Your condition will be monitored carefully  while you are receiving this medicine. Be careful to avoid injury while the area is numb, and you are not aware of pain. What side effects may I notice from receiving this medicine? Side effects that you should report to your doctor or health care professional as soon as possible:  allergic reactions like skin rash, itching or hives, swelling of the face, lips, or tongue  seizures  signs and symptoms of a dangerous change in heartbeat or heart rhythm like chest pain; dizziness; fast, irregular heartbeat; palpitations; feeling faint or lightheaded; falls; breathing problems  signs and symptoms of methemoglobinemia such as pale, gray, or blue colored skin; headache; fast heartbeat; shortness of breath; feeling faint or lightheaded, falls; tiredness Side effects that usually do not require medical attention (report to your doctor or health care professional if they continue or are bothersome):  anxious  back pain  changes in taste  changes in vision  constipation  dizziness  fever  nausea, vomiting This list may not describe all possible side effects. Call your doctor for medical advice about side effects. You may report side effects to FDA at 1-800-FDA-1088. Where should I keep my medicine? This drug is given in a hospital or clinic and will not be stored at home. NOTE: This sheet is a summary. It may not cover all possible information. If you have questions about this medicine, talk to your doctor, pharmacist, or health care provider.  2020 Elsevier/Gold Standard (2018-10-30 10:48:23)   AMBULATORY SURGERY  DISCHARGE INSTRUCTIONS   1) The drugs that you were given will stay in your system until tomorrow so for the next 24 hours you should not:  A) Drive an automobile B) Make any legal decisions C) Drink any alcoholic beverage   2) You may resume regular meals tomorrow.  Today it is better to start with liquids and gradually work up to solid foods.  You may eat anything  you prefer, but it is better to start with liquids, then soup and crackers, and gradually work up to solid foods.   3) Please notify your doctor immediately if you have any unusual bleeding, trouble breathing, redness and pain at the surgery site, drainage, fever, or pain not relieved by medication.    4) Additional Instructions:        Please contact your physician with any problems or Same Day Surgery at [phone], Monday through Friday 6 am to 4 pm, or [place] [facility] number at [phone].

## 2019-12-30 NOTE — Anesthesia Postprocedure Evaluation (Signed)
Anesthesia Post Note  Patient: Johnathan Potts  Procedure(s) Performed: OPEN L4/5 LUMBAR LAMINECTOMY (N/A )  Patient location during evaluation: PACU Anesthesia Type: General Level of consciousness: awake and alert and oriented Pain management: pain level controlled Vital Signs Assessment: post-procedure vital signs reviewed and stable Respiratory status: spontaneous breathing, nonlabored ventilation and respiratory function stable Cardiovascular status: blood pressure returned to baseline and stable Postop Assessment: no signs of nausea or vomiting Anesthetic complications: no   No complications documented.   Last Vitals:  Vitals:   12/30/19 1035 12/30/19 1135  BP: (!) 164/73 (!) 145/82  Pulse: 93 88  Resp: 16 16  Temp: 36.9 C   SpO2: 98% 100%    Last Pain:  Vitals:   12/30/19 1135  TempSrc:   PainSc: 3                  Tim Corriher

## 2019-12-30 NOTE — Anesthesia Procedure Notes (Signed)
Procedure Name: Intubation Date/Time: 12/30/2019 7:23 AM Performed by: Henrietta Hoover, CRNA Pre-anesthesia Checklist: Patient identified, Emergency Drugs available, Suction available and Patient being monitored Patient Re-evaluated:Patient Re-evaluated prior to induction Oxygen Delivery Method: Circle system utilized Preoxygenation: Pre-oxygenation with 100% oxygen Induction Type: IV induction Ventilation: Mask ventilation without difficulty Laryngoscope Size: McGraph and 4 Grade View: Grade I Tube type: Oral Tube size: 7.0 mm Number of attempts: 1 Airway Equipment and Method: Stylet and Video-laryngoscopy Placement Confirmation: ETT inserted through vocal cords under direct vision,  positive ETCO2 and breath sounds checked- equal and bilateral Secured at: 21 cm Tube secured with: Tape Dental Injury: Teeth and Oropharynx as per pre-operative assessment

## 2019-12-30 NOTE — Transfer of Care (Signed)
Immediate Anesthesia Transfer of Care Note  Patient: Johnathan Potts  Procedure(s) Performed: OPEN L4/5 LUMBAR LAMINECTOMY (N/A )  Patient Location: PACU  Anesthesia Type:General  Level of Consciousness: awake, drowsy and patient cooperative  Airway & Oxygen Therapy: Patient Spontanous Breathing  Post-op Assessment: Report given to RN, Post -op Vital signs reviewed and stable and Patient moving all extremities  Post vital signs: Reviewed and stable  Last Vitals:  Vitals Value Taken Time  BP 148/83 12/30/19 0955  Temp 36.8 C 12/30/19 0955  Pulse 100 12/30/19 1002  Resp 16 12/30/19 1002  SpO2 99 % 12/30/19 1002  Vitals shown include unvalidated device data.  Last Pain:  Vitals:   12/30/19 0955  TempSrc:   PainSc: 0-No pain         Complications: No complications documented.

## 2019-12-30 NOTE — OR Nursing (Addendum)
May resume taking aspirin tomorrow per Dr. Adriana Simas, secure chat.  Added to d/c instructions/med section.  Update 1047 am  - pt to wear his back brace (in pt's belongings bag) when he's up moving around.  Added to d/c instructions.  Also, Dr.Cook will be to postop to print Norco rx when he's finished in OR.  All this per [facility] in the OR room.  Update 11:34 am - Dr. Adriana Simas in to see pt and told him rx for narcotic was sent to pt's pharmacy via computer since he couldn't print it.

## 2019-12-30 NOTE — H&P (Signed)
Johnathan Potts is an 49 y.o. male.   Chief Complaint: Right leg pain HPI:Johnathan Potts is here for evaluation of ongoing symptoms in the right leg consisting of pain and numbness. He states that this was preceded by pain going into the left leg but a few months ago it switched over to the right leg. The current pain is mostly in the anterior thigh which is sharp and burning. He then has numbness that goes down the anterior lower leg towards the foot. He denies any current symptoms in the left leg. He does not have any posterior leg pain. He does comment on some mild back discomfort but is not have any significant pain. He does note some crackling and popping sounds. He has undergone treatments including physical therapy and steroid injections without any significant relief. He more recently has attempted hydrocodone and found this makes him hyperactive. He did undergo an MRI of the lumbar spine showings stenosis at L4/5 and we discussed decompression at that level. He is here to proceed   Past Medical History:  Diagnosis Date  . Diabetes mellitus without complication ([place])   . History of kidney stones    2006- lithotripsy  . Hypertension     Past Surgical History:  Procedure Laterality Date  . APPENDECTOMY     2001  . LASIK Bilateral 2006   [facility]    Family History  Problem Relation Age of Onset  . Diabetes Mother   . Diabetes Father   . Diabetes Brother    Social History:  reports that he has been smoking cigarettes. He has a 35.00 pack-year smoking history. He has never used smokeless tobacco. He reports current alcohol use. He reports that he does not use drugs.  Allergies: No Known Allergies  Medications Prior to Admission  Medication Sig Dispense Refill  . amLODipine (NORVASC) 5 MG tablet Take 5 mg by mouth daily.    Marland Kitchen aspirin EC 81 MG tablet Take 81 mg by mouth daily. Swallow whole.    . Blood Glucose Monitoring Suppl (ONE TOUCH ULTRA 2) w/Device KIT as directed.     . Continuous Blood Gluc Receiver ([place]) DEVI as directed.    . Continuous Blood Gluc Sensor (DEXCOM G6 SENSOR) MISC as directed.    . Continuous Blood Gluc Transmit (DEXCOM G6 TRANSMITTER) MISC as directed.    . fenofibrate 160 MG tablet Take 160 mg by mouth daily.    . hydrochlorothiazide (HYDRODIURIL) 25 MG tablet Take 25 mg by mouth daily.    Marland Kitchen HYDROcodone-acetaminophen (NORCO) 10-325 MG tablet Take 1 tablet by mouth every 6 (six) hours as needed for pain.    Marland Kitchen lisinopril (ZESTRIL) 20 MG tablet Take 20 mg by mouth 2 (two) times daily.    . magnesium oxide (MAG-OX) 400 MG tablet Take 400 mg by mouth 2 (two) times daily.     . Melatonin 3 MG CAPS Take 3 mg by mouth at bedtime.    . meloxicam (MOBIC) 15 MG tablet Take 15 mg by mouth in the morning and at bedtime.     . metFORMIN (GLUCOPHAGE-XR) 750 MG 24 hr tablet Take 750 mg by mouth 2 (two) times daily.    . Multiple Vitamins-Minerals (MULTIVITAMIN GUMMIES ADULT PO) Take 2 tablets by mouth daily.    Marland Kitchen omeprazole (PRILOSEC OTC) 20 MG tablet Take 20 mg by mouth every other day.    Marland Kitchen OZEMPIC, 0.25 OR 0.5 MG/DOSE, 2 MG/1.5ML SOPN Inject 0.5 mg into the skin  once a week.     . simvastatin (ZOCOR) 20 MG tablet Take 20 mg by mouth at bedtime.    . betamethasone dipropionate 0.05 % cream Apply 1 application topically as needed. (Patient not taking: Reported on 12/19/2019)    . cyclobenzaprine (FLEXERIL) 10 MG tablet Take 10 mg by mouth 3 (three) times daily as needed. (Patient not taking: Reported on 12/19/2019)    . insulin NPH Human (HUMULIN N) 100 UNIT/ML injection Inject 15 Units into the skin as needed. (Patient not taking: Reported on 12/19/2019)    . Multiple Vitamin (MULTI-VITAMIN) tablet Take 1 tablet by mouth daily. (Patient not taking: Reported on 12/19/2019)      No results found for this or any previous visit (from the past 48 hour(s)). No results found.  Review of Systems General ROS: Negative Psychological ROS:  Negative Ophthalmic ROS: Negative ENT ROS: Negative Hematological and Lymphatic ROS: Negative  Endocrine ROS: Negative Respiratory ROS: Negative Cardiovascular ROS: Negative Gastrointestinal ROS: Negative Genito-Urinary ROS: Negative Musculoskeletal ROS: Positive for back pain Neurological ROS: Positive for leg pain Dermatological ROS: Negative  Blood pressure (!) 160/78, pulse 85, temperature 97.9 F (36.6 C), temperature source Oral, resp. rate 18, height '5\' 5"'  (1.651 m), weight 71.2 kg, SpO2 100 %. Physical Exam  General appearance: Alert, cooperative, in no acute distress Head: Normocephalic, atraumatic Eyes: Normal, EOM intact Oropharynx: Wearing facemask CV: regular rate and rhythm Pulm: Clear to auscultation back: No tenderness to palpation over the midline or paramedian regions Ext: No edema in LE bilaterally  Neurologic exam:  Mental status: alertness: alert, affect: normal Speech: fluent and clear Motor:strength symmetric 5/5 in bilateral lower extremities in all motor groups Sensory: intact to light touch in bilateral lower extremities Gait: normal    Imaging: MRI lumbar spine: There is a normal lordotic curvature. There is severe degenerative disease noted at L4-5 level and more mild at the L5-S1 level with some disc bulging at these levels. There is a right eccentric disc protrusion at the L4-5 level which also results in moderate to severe central stenosis. There is no significant central foraminal stenosis noted at the upper levels. There is facet arthropathy also noted at the L4-5 level.   Assessment/Plan -L4-5 laminectomy with right L4-5 discectomy   Deetta Perla, MD 12/30/2019, 6:32 AM

## 2019-12-30 NOTE — Anesthesia Preprocedure Evaluation (Signed)
Anesthesia Evaluation  Patient identified by MRN, date of birth, ID band Patient awake    Reviewed: Allergy & Precautions, NPO status , Patient's Chart, lab work & pertinent test results  History of Anesthesia Complications Negative for: history of anesthetic complications  Airway Mallampati: III  TM Distance: >3 FB Neck ROM: Full    Dental no notable dental hx. (+) Teeth Intact   Pulmonary neg sleep apnea, neg COPD, Current Smoker and Patient abstained from smoking.,    Pulmonary exam normal breath sounds clear to auscultation       Cardiovascular Exercise Tolerance: Good METShypertension, (-) CAD and (-) Past MI (-) dysrhythmias  Rhythm:Regular Rate:Normal - Systolic murmurs    Neuro/Psych negative neurological ROS  negative psych ROS   GI/Hepatic neg GERD  ,(+)     (-) substance abuse  ,   Endo/Other  diabetes, Type 2  Renal/GU negative Renal ROS     Musculoskeletal   Abdominal   Peds  Hematology   Anesthesia Other Findings Past Medical History: No date: Diabetes mellitus without complication (HCC) No date: History of kidney stones     Comment:  2006- lithotripsy No date: Hypertension  Reproductive/Obstetrics                             Anesthesia Physical Anesthesia Plan  ASA: II  Anesthesia Plan: General   Post-op Pain Management:    Induction: Intravenous  PONV Risk Score and Plan: 1 and Ondansetron, Dexamethasone and Midazolam  Airway Management Planned: Oral ETT  Additional Equipment: None  Intra-op Plan:   Post-operative Plan: Extubation in OR  Informed Consent: I have reviewed the patients History and Physical, chart, labs and discussed the procedure including the risks, benefits and alternatives for the proposed anesthesia with the patient or authorized representative who has indicated his/her understanding and acceptance.     Dental advisory given  Plan  Discussed with: CRNA and Surgeon  Anesthesia Plan Comments: (Discussed risks of anesthesia with patient, including PONV, sore throat, lip/dental damage. Rare risks discussed as well, such as cardiorespiratory and neurological sequelae. Patient understands. Patient counseled on benefits of smoking cessation, and increased perioperative risks associated with continued smoking. )        Anesthesia Quick Evaluation

## 2019-12-30 NOTE — Op Note (Signed)
Operative Note   SURGERY DATE:  12/30/2019   PRE-OP DIAGNOSIS:  Lumbar Stenosis with radiculopathy   POST-OP DIAGNOSIS: Post-Op Diagnosis Codes: Lumbar Stenosis with radiculopathy   Procedure(s) with comments: L4/5 Laminectomy with Right L4/5 discectomy  SURGEON:     * Nathaniel Man, MD       ANESTHESIA: General    OPERATIVE FINDINGS: Compression at L4/5   Indication: Johnathan Potts presented to the clinic on 11/16 with ongoing right leg pain  MRI revealed soft tissue causing stenosis at L4/5. L4/5 laminectomy and L4/5 right discectomy was discussed to relieve symptoms. The risks of surgery were explained to include hematoma, infection, damage to nerve roots, CSF leak, weakness, numbness, pain, need for future surgery, heart attack, and stroke. He elected to proceed with surgery for symptom relief.    Procedure The patient was brought to the OR after informed consent was obtained. He was given general anesthesia and intubated by the anesthesia service. Vascular access lines were placed.The patient was then placed prone on a Wilson frame ensuring all pressure points were padded. A time-out was performed per protocol.    The patient was sterilely prepped and draped. The incision was planned using fluoroscopy and instilled with local anesthetic with epinephrine.  The skin was opened sharply and the dissection taken to the fascia. This was incised and cautery was used to dissect the subperiosteal plane to expose the spinous processes and lamina of L4-5. Dissection was continued laterally in order to expose the medial facets at the L4/L5 level on the right. Retractors were inserted and hemostasis was achieved. X-ray was used to confirm location at the L4/5 interspace.   Next, a 9mm drill bit was used to remove the L4 lamina centrally. The underlying ligament was freed and removed with combination of rongeurs. The decompression was taken caudal to the superior border of L5. The dura was exposed  and freed from ligament.  Next attention was turned to the lateral stenosis. A medial facetectomy was performed on the right and then ligament removed. The dura was retracted medially and a firm disc space seen. The level was again confirmed. The PLL was incised and a blunt probe and curette used to probe the space but no soft disc herniation seen. The epidural space was inspected and found to be flat. The exiting nerve and traversing nerve were free.    Once the dura appeared free from all the bone edges and all soft tissue compression was removed, hemostasis was obtained.  Depomedrol was placed on right.  The muscle was then closed using 0 vicryl followed by the fascia with 0 vicryl. Exparel was injected into the muscle. Next, multiple subcutaneous and dermal layers were closed with 2-0 vicryl until the epidermis was well approximated. The skin was closed with Dermabond. A dressing was applied.   The patient was returned to supine position and extubated by the anesthesia service. The patient was then taken to the PACU for post-operative care where he was moving extremities symmetrically.   ESTIMATED BLOOD LOSS:   50 cc   SPECIMENS None   IMPLANT None     I performed the case in its entirety with assistance of PA, Dorian Furnace, MD [phone]

## 2019-12-30 NOTE — Interval H&P Note (Signed)
History and Physical Interval Note:  12/30/2019 6:35 AM  Johnathan Potts  has presented today for surgery, with the diagnosis of Spinal stenosis of lumbar region without neurogenic claudication Lumbar radiculopathy.  The various methods of treatment have been discussed with the patient and family. After consideration of risks, benefits and other options for treatment, the patient has consented to  Procedure(s): OPEN L4/5 LUMBAR LAMINECTOMY (N/A) as a surgical intervention.  The patient's history has been reviewed, patient examined, no change in status, stable for surgery.  I have reviewed the patient's chart and labs.  Questions were answered to the patient's satisfaction.     Lucy Chris

## 2020-02-17 ENCOUNTER — Encounter (INDEPENDENT_AMBULATORY_CARE_PROVIDER_SITE_OTHER): Payer: BC Managed Care – PPO | Admitting: Ophthalmology

## 2021-08-01 IMAGING — RF DG LUMBAR SPINE 2-3V
1 series · 1 of 1 positions shown · non-contrast
Comparison: None.

CLINICAL DATA: L4-L5 lumbar laminectomy.

EXAM:
DG C-ARM 1-60 MIN; LUMBAR SPINE - 2-3 VIEW
FLUOROSCOPY TIME:  Fluoroscopy Time: 4.6 sec dense

[Series 1: dg x-ray · 0.20mm/px · 1 of 1 slices shown]
[im 1/1]
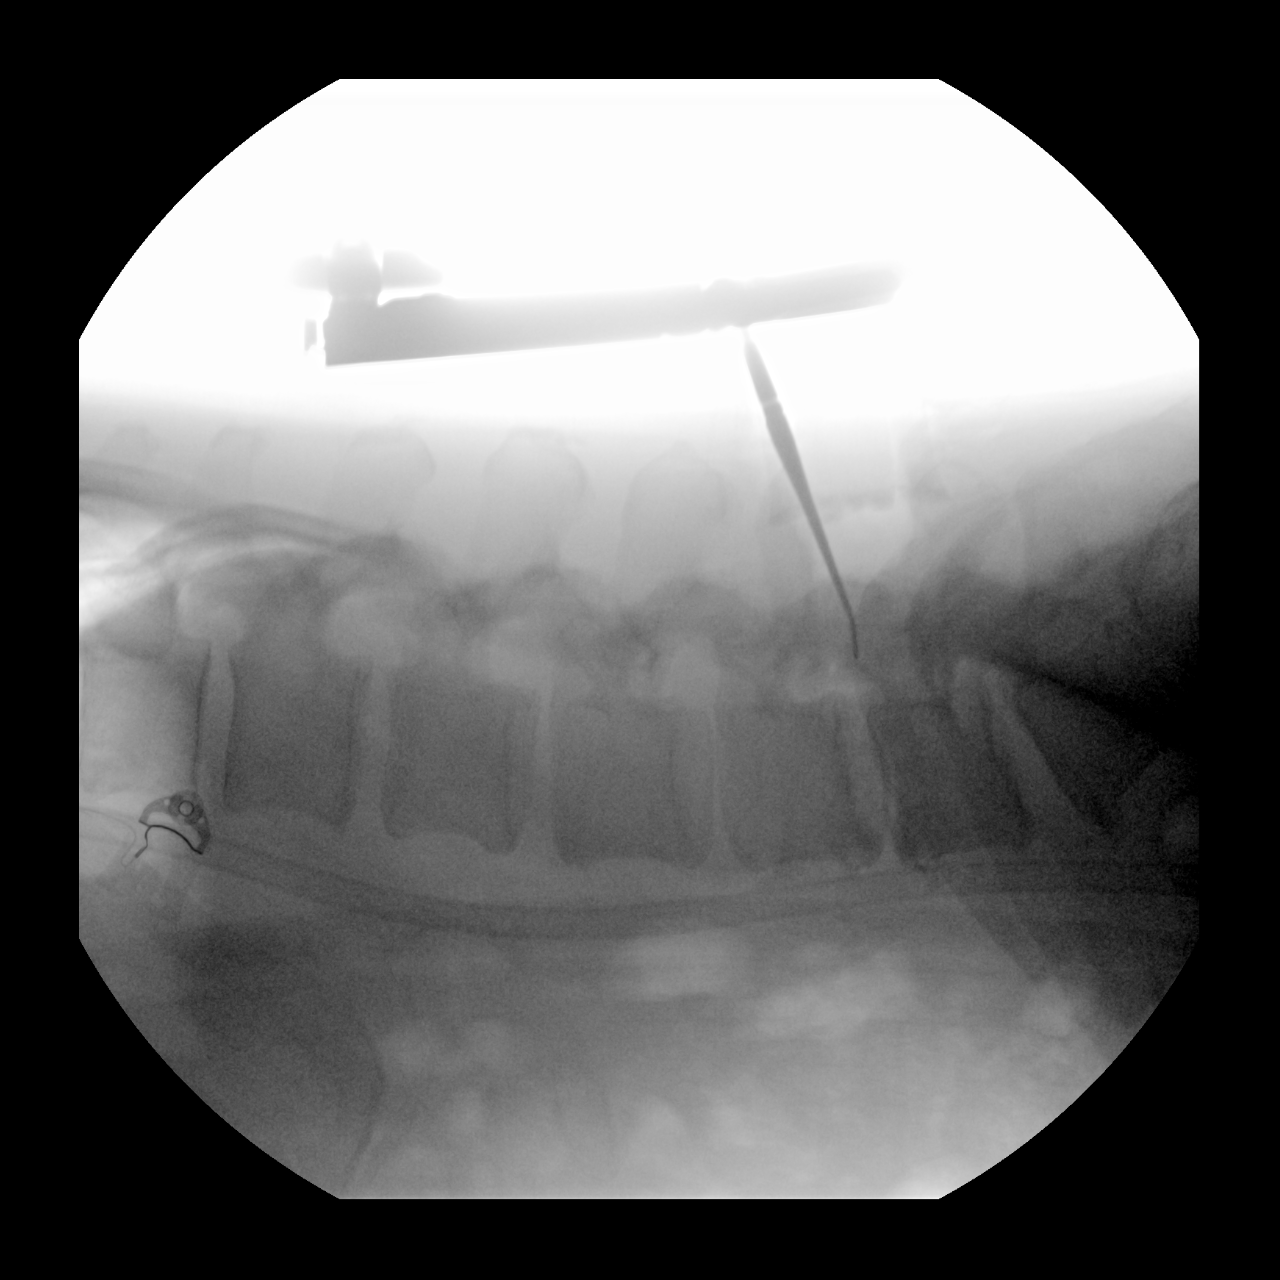

[1 of 1 positions shown; findings below may reference images not displayed]

FINDINGS: For the purposes of this dictation, the inferior-most fully formed
intervertebral disc is considered L5-S1.

Single C-arm fluoroscopic images were obtained intraoperatively and
submitted for post operative interpretation. This image demonstrates
surgical probe at the L4-L5 level. Please see the performing
provider's procedural report for further detail.
IMPRESSION: Intraoperative fluoroscopic imaging, as above.

## 2021-08-17 ENCOUNTER — Other Ambulatory Visit: Payer: Self-pay | Admitting: Nurse Practitioner

## 2021-08-17 DIAGNOSIS — F1020 Alcohol dependence, uncomplicated: Secondary | ICD-10-CM

## 2021-08-17 DIAGNOSIS — R7401 Elevation of levels of liver transaminase levels: Secondary | ICD-10-CM

## 2021-08-17 DIAGNOSIS — K219 Gastro-esophageal reflux disease without esophagitis: Secondary | ICD-10-CM

## 2021-08-25 ENCOUNTER — Ambulatory Visit
Admission: RE | Admit: 2021-08-25 | Discharge: 2021-08-25 | Disposition: A | Discharge status: Home / Self Care | Payer: BC Managed Care – PPO | Source: Ambulatory Visit | Attending: Nurse Practitioner | Admitting: Nurse Practitioner

## 2021-08-25 DIAGNOSIS — R7401 Elevation of levels of liver transaminase levels: Secondary | ICD-10-CM | POA: Insufficient documentation

## 2021-08-25 DIAGNOSIS — K219 Gastro-esophageal reflux disease without esophagitis: Secondary | ICD-10-CM | POA: Insufficient documentation

## 2021-08-25 DIAGNOSIS — F1020 Alcohol dependence, uncomplicated: Secondary | ICD-10-CM | POA: Insufficient documentation

## 2021-09-15 HISTORY — PX: ESOPHAGOGASTRODUODENOSCOPY: SHX1529

## 2021-09-15 HISTORY — PX: COLONOSCOPY: SHX174

## 2022-03-10 ENCOUNTER — Other Ambulatory Visit: Payer: Self-pay | Admitting: Orthopedic Surgery

## 2022-03-10 DIAGNOSIS — M48061 Spinal stenosis, lumbar region without neurogenic claudication: Secondary | ICD-10-CM

## 2022-03-21 ENCOUNTER — Ambulatory Visit
Admission: RE | Admit: 2022-03-21 | Discharge: 2022-03-21 | Disposition: A | Discharge status: Home / Self Care | Payer: BC Managed Care – PPO | Source: Ambulatory Visit | Attending: Orthopedic Surgery | Admitting: Orthopedic Surgery

## 2022-03-21 DIAGNOSIS — M48061 Spinal stenosis, lumbar region without neurogenic claudication: Secondary | ICD-10-CM

## 2022-03-24 ENCOUNTER — Other Ambulatory Visit: Payer: Worker's Compensation

## 2022-06-01 NOTE — Progress Notes (Deleted)
Referring Physician:  Elijah Birk, MD [address] [place],  [place] [address]  Primary Physician:  Dorothey Baseman, MD  History of Present Illness: 06/01/2022 [name] is here today with a chief complaint of ***  history of a prior L4 laminectomy by Richrd Sox, MD. 2022? He has pain in lower back, and it is constantly sore. He is experiencing severe pain in his back now. The patient states the last time it was on the right side, but now it is more on the left     Duration: *** 3 month Location: ***low back and left leg pain Occasionally has mild pain in the right leg but has more pain in the left buttock and occasionally experiences mild shooting pain down the buttock. Chronic bilateral low back pain with right-sided sciatica    Quality: ***sharp, aching, tight , tingling numbness  Severity: ***  Precipitating: aggravated by *** standing walking twisting, laying down, sitting Modifying factors: made better by *** Weakness: none Timing: ***constant Bowel/Bladder Dysfunction: none  Conservative measures:  Physical therapy: ***  Multimodal medical therapy including regular antiinflammatories: ***  Injections: *** epidural steroid injections  TRANSFORAMINAL EPIDURAL STEROID INJECTION 05/25/2022 1 05/06/2022    Past Surgery: *** 2022?  Johnathan Potts has ***no symptoms of cervical myelopathy.  The symptoms are causing a significant impact on the patient's life.   I have utilized the care everywhere function in epic to review the outside records available from external health systems.  Review of Systems:  A 10 point review of systems is negative, except for the pertinent positives and negatives detailed in the HPI.  Past Medical History: Past Medical History:  Diagnosis Date   Diabetes mellitus without complication (HCC)    History of kidney stones    2006- lithotripsy   Hypertension     Past Surgical History: Past Surgical History:  Procedure  Laterality Date   APPENDECTOMY     2001   LASIK Bilateral 2006   [facility]   LUMBAR LAMINECTOMY/DECOMPRESSION MICRODISCECTOMY N/A 12/30/2019   Procedure: OPEN L4/5 LUMBAR LAMINECTOMY;  Surgeon: Lucy Chris, MD;  Location: [facility] ORS;  Service: Neurosurgery;  Laterality: N/A;    Allergies: Allergies as of 06/02/2022   (No Known Allergies)    Medications:  Current Outpatient Medications:    amLODipine (NORVASC) 5 MG tablet, Take 5 mg by mouth daily., Disp: , Rfl:    aspirin EC 81 MG tablet, Take 81 mg by mouth daily. Swallow whole., Disp: , Rfl:    betamethasone dipropionate 0.05 % cream, Apply 1 application topically as needed. (Patient not taking: Reported on 12/19/2019), Disp: , Rfl:    Continuous Blood Gluc Receiver (DEXCOM G6 RECEIVER) DEVI, as directed., Disp: , Rfl:    Continuous Blood Gluc Sensor (DEXCOM G6 SENSOR) MISC, as directed., Disp: , Rfl:    Continuous Blood Gluc Transmit (DEXCOM G6 TRANSMITTER) MISC, as directed., Disp: , Rfl:    cyclobenzaprine (FLEXERIL) 10 MG tablet, Take 1 tablet (10 mg total) by mouth 3 (three) times daily as needed for muscle spasms., Disp: 30 tablet, Rfl: 0   cyclobenzaprine (FLEXERIL) 10 MG tablet, Take 1 tablet (10 mg total) by mouth 3 (three) times daily as needed for muscle spasms., Disp: 30 tablet, Rfl: 0   fenofibrate 160 MG tablet, Take 160 mg by mouth daily., Disp: , Rfl:    hydrochlorothiazide (HYDRODIURIL) 25 MG tablet, Take 25 mg by mouth daily., Disp: , Rfl:    insulin NPH Human (  HUMULIN N) 100 UNIT/ML injection, Inject 15 Units into the skin as needed. (Patient not taking: Reported on 12/19/2019), Disp: , Rfl:    lisinopril (ZESTRIL) 20 MG tablet, Take 20 mg by mouth 2 (two) times daily., Disp: , Rfl:    magnesium oxide (MAG-OX) 400 MG tablet, Take 400 mg by mouth 2 (two) times daily. , Disp: , Rfl:    Melatonin 3 MG CAPS, Take 3 mg by mouth at bedtime., Disp: , Rfl:    meloxicam (MOBIC) 15 MG tablet, Take 15 mg by mouth  in the morning and at bedtime. , Disp: , Rfl:    metFORMIN (GLUCOPHAGE-XR) 750 MG 24 hr tablet, Take 750 mg by mouth 2 (two) times daily., Disp: , Rfl:    Multiple Vitamin (MULTI-VITAMIN) tablet, Take 1 tablet by mouth daily. (Patient not taking: Reported on 12/19/2019), Disp: , Rfl:    Multiple Vitamins-Minerals (MULTIVITAMIN GUMMIES ADULT PO), Take 2 tablets by mouth daily., Disp: , Rfl:    omeprazole (PRILOSEC OTC) 20 MG tablet, Take 20 mg by mouth every other day., Disp: , Rfl:    OZEMPIC, 0.25 OR 0.5 MG/DOSE, 2 MG/1.5ML SOPN, Inject 0.5 mg into the skin once a week. , Disp: , Rfl:    simvastatin (ZOCOR) 20 MG tablet, Take 20 mg by mouth at bedtime., Disp: , Rfl:   Social History: Social History   Tobacco Use   Smoking status: Every Day    Packs/day: 1.00    Years: 35.00    Additional pack years: 0.00    Total pack years: 35.00    Types: Cigarettes   Smokeless tobacco: Never  Substance Use Topics   Alcohol use: Yes    Comment: social   Drug use: Never    Family Medical History: Family History  Problem Relation Age of Onset   Diabetes Mother    Diabetes Father    Diabetes Brother     Physical Examination: There were no vitals filed for this visit.  General: Patient is in no apparent distress. Attention to examination is appropriate.  Neck:   Supple.  Full range of motion.  Respiratory: Patient is breathing without any difficulty.   NEUROLOGICAL:     Awake, alert, oriented to person, place, and time.  Speech is clear and fluent.   Cranial Nerves: Pupils equal round and reactive to light.  Facial tone is symmetric.  Facial sensation is symmetric. Shoulder shrug is symmetric. Tongue protrusion is midline.  There is no pronator drift.  Strength: Side Biceps Triceps Deltoid Interossei Grip Wrist Ext. Wrist Flex.  R 5 5 5 5 5 5 5   L 5 5 5 5 5 5 5    Side Iliopsoas Quads Hamstring PF DF EHL  R 5 5 5 5 5 5   L 5 5 5 5 5 5    Reflexes are ***2+ and symmetric at the  biceps, triceps, brachioradialis, patella and achilles.   Hoffman's is absent.   Bilateral upper and lower extremity sensation is intact to light touch.    No evidence of dysmetria noted.  Gait is normal.     Medical Decision Making  Imaging: ***  I have personally reviewed the images and agree with the above interpretation.  Assessment and Plan: Mr. Gruszka is a pleasant 52 y.o. male with ***    Thank you for involving me in the care of this patient.      Chester K. Myer Haff MD, [facility] Neurosurgery

## 2022-06-02 ENCOUNTER — Ambulatory Visit: Payer: BC Managed Care – PPO | Admitting: Neurosurgery

## 2022-06-06 NOTE — Progress Notes (Unsigned)
Referring Physician:  Elijah Birk, MD [address] [place],  [place] [address]  Primary Physician:  Dorothey Baseman, MD  History of Present Illness: 06/07/2022 [name] is here today with a chief complaint of back and leg pain.  He has pain into both legs that is sometimes worse on the right than the left.  He has tingling and numbness in his left leg.  Is been ongoing worsening over the past 6 months.  His pain can be as bad as 10 out of 10 in his buttocks and lower back.  Standing, walking, twisting, and laying down makes it worse.  Standing and changing positions or sitting down makes it better.  He tried physical therapy but his pain did not improve.  He was discharged.   Bowel/Bladder Dysfunction: none  Conservative measures:  Physical therapy:  has participated in at [facility] from 01/27/22 to 02/23/22  Multimodal medical therapy including regular antiinflammatories:  gabapentin, meloxicam, flexeril  Injections:  has received epidural steroid injections 06/03/22: Bilateral L3 and L4 medial branch and L5 dorsal ramus blocks  05/06/22: Bilateral L5-S1 TF ESI (relief x1 week) 03/31/22: Left L5-S1 and Left S1 TF ESI (temporary relief) 08/14/20: Bilateral L4-5 and L5-S1 facet joint injections (75% improvement)   Past Surgery:  12/30/19: L4-5 Laminectomy by Dr. [name] has no symptoms of cervical myelopathy.  The symptoms are causing a significant impact on the patient's life.   I have utilized the care everywhere function in epic to review the outside records available from external health systems.  Review of Systems:  A 10 point review of systems is negative, except for the pertinent positives and negatives detailed in the HPI.  Past Medical History: Past Medical History:  Diagnosis Date   Diabetes mellitus without complication (HCC)    History of kidney stones    2006- lithotripsy   Hypertension     Past Surgical History: Past  Surgical History:  Procedure Laterality Date   APPENDECTOMY     2001   LASIK Bilateral 2006   [facility]   LUMBAR LAMINECTOMY/DECOMPRESSION MICRODISCECTOMY N/A 12/30/2019   Procedure: OPEN L4/5 LUMBAR LAMINECTOMY;  Surgeon: Lucy Chris, MD;  Location: [facility] ORS;  Service: Neurosurgery;  Laterality: N/A;    Allergies: Allergies as of 06/07/2022   (No Known Allergies)    Medications:  Current Outpatient Medications:    metoprolol succinate (TOPROL-XL) 50 MG 24 hr tablet, Take 2 tablets by mouth daily., Disp: , Rfl:    Na Sulfate-K Sulfate-Mg Sulf 17.5-3.13-1.6 GM/177ML SOLN, Take by mouth., Disp: , Rfl:    aspirin EC 81 MG tablet, Take 81 mg by mouth daily. Swallow whole., Disp: , Rfl:    betamethasone dipropionate 0.05 % cream, Apply 1 application topically as needed. (Patient not taking: Reported on 12/19/2019), Disp: , Rfl:    Continuous Blood Gluc Receiver (DEXCOM G6 RECEIVER) DEVI, as directed., Disp: , Rfl:    Continuous Blood Gluc Sensor (DEXCOM G6 SENSOR) MISC, as directed., Disp: , Rfl:    Continuous Blood Gluc Transmit (DEXCOM G6 TRANSMITTER) MISC, as directed., Disp: , Rfl:    cyclobenzaprine (FLEXERIL) 10 MG tablet, Take 1 tablet (10 mg total) by mouth 3 (three) times daily as needed for muscle spasms., Disp: 30 tablet, Rfl: 0   cyclobenzaprine (FLEXERIL) 10 MG tablet, Take 1 tablet (10 mg total) by mouth 3 (three) times daily as needed for muscle spasms., Disp: 30 tablet, Rfl: 0   fenofibrate 160 MG  tablet, Take 160 mg by mouth daily., Disp: , Rfl:    hydrochlorothiazide (HYDRODIURIL) 25 MG tablet, Take 25 mg by mouth daily., Disp: , Rfl:    insulin NPH Human (HUMULIN N) 100 UNIT/ML injection, Inject 15 Units into the skin as needed. (Patient not taking: Reported on 12/19/2019), Disp: , Rfl:    lisinopril (ZESTRIL) 20 MG tablet, Take 20 mg by mouth 2 (two) times daily., Disp: , Rfl:    magnesium oxide (MAG-OX) 400 MG tablet, Take 400 mg by mouth 2 (two) times  daily. , Disp: , Rfl:    metFORMIN (GLUCOPHAGE-XR) 750 MG 24 hr tablet, Take 750 mg by mouth 2 (two) times daily., Disp: , Rfl:    Multiple Vitamin (MULTI-VITAMIN) tablet, Take 1 tablet by mouth daily. (Patient not taking: Reported on 12/19/2019), Disp: , Rfl:    Multiple Vitamins-Minerals (MULTIVITAMIN GUMMIES ADULT PO), Take 2 tablets by mouth daily., Disp: , Rfl:    omeprazole (PRILOSEC OTC) 20 MG tablet, Take 20 mg by mouth every other day., Disp: , Rfl:    OZEMPIC, 0.25 OR 0.5 MG/DOSE, 2 MG/1.5ML SOPN, Inject 0.5 mg into the skin once a week. , Disp: , Rfl:    simvastatin (ZOCOR) 20 MG tablet, Take 20 mg by mouth at bedtime., Disp: , Rfl:   Social History: Social History   Tobacco Use   Smoking status: Every Day    Packs/day: 1.00    Years: 35.00    Additional pack years: 0.00    Total pack years: 35.00    Types: Cigarettes   Smokeless tobacco: Never  Substance Use Topics   Alcohol use: Yes    Comment: social   Drug use: Never    Family Medical History: Family History  Problem Relation Age of Onset   Diabetes Mother    Diabetes Father    Diabetes Brother     Physical Examination: Vitals:   06/07/22 0816 06/07/22 0849  BP: (!) 160/90 (!) 160/84    General: Patient is in no apparent distress. Attention to examination is appropriate.  Neck:   Supple.  Full range of motion.  Respiratory: Patient is breathing without any difficulty.   NEUROLOGICAL:     Awake, alert, oriented to person, place, and time.  Speech is clear and fluent.   Cranial Nerves: Pupils equal round and reactive to light.  Facial tone is symmetric.  Facial sensation is symmetric. Shoulder shrug is symmetric. Tongue protrusion is midline.  There is no pronator drift.  Strength: Side Biceps Triceps Deltoid Interossei Grip Wrist Ext. Wrist Flex.  R 5 5 5 5 5 5 5   L 5 5 5 5 5 5 5    Side Iliopsoas Quads Hamstring PF DF EHL  R 5 5 5 5 5 5   L 5 5 5 5 5 5    Reflexes are 1+ and symmetric at the  biceps, triceps, brachioradialis, patella and achilles.   Hoffman's is absent.   Bilateral upper and lower extremity sensation is intact to light touch.    No evidence of dysmetria noted.  Gait is antalgic.  Straight leg raise is positive at 60 degrees.     Medical Decision Making  Imaging: MRI L spine 03/21/2022 L4-L5: Prior posterior decompression. Prominent diffuse disc bulging and endplate spurring. Moderate right and mild left facet arthropathy with ligamentum flavum hypertrophy. Severe spinal canal stenosis. Severe right and moderate left neuroforaminal stenosis.   L5-S1: Mild disc bulging with superimposed down turning left subarticular disc protrusion with impingement of  the descending left S1 nerve root. Mild bilateral facet arthropathy. Mild spinal canal and bilateral neuroforaminal stenosis.   IMPRESSION: 1. Multilevel degenerative changes of the lumbar spine as described above. Severe spinal canal and right neuroforaminal stenosis at L4-L5. 2. Left subarticular disc protrusion at L5-S1 with impingement of the descending left S1 nerve root.     Electronically Signed   By: Obie Dredge M.D.   On: 03/22/2022 15:01  I have personally reviewed the images and agree with the above interpretation.  Assessment and Plan: Mr. Grgas is a pleasant 52 y.o. male with low back pain with bilateral sciatica.  His pain is likely due to increased collapse of his L4-5 disc space that has now caused substantial and severe spinal canal stenosis with neuroforaminal stenosis at L4-5 compressing the right greater than left L4 nerve roots.  He does have discomfort on the front of his thighs consistent with L4 involvement.  He also has pain in his buttocks consistent with L5 or S1 involvement.  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  He has tried and failed conservative management including physical therapy, medications, and injections.  At this point, I have  recommended surgical intervention.  We discussed L4-5 lateral lumbar interbody fusion with left-sided L5-S1 microdiscectomy.  Before moving forward with this, he will need to discontinue smoking or any nicotine containing products.  Once he is off for 30 days, we will consider moving forward.  I think an L4-5 fusion is necessary to decompress his L4 nerve roots as he has had significant collapse of his L4-5 disc since his last surgery and now has foraminal stenosis caused by loss of interpedicular height.  He will have his regular labs drawn soon which should show that he has improved blood sugar control.  I spent a total of 30 minutes in this patient's care today. This time was spent reviewing pertinent records including imaging studies, obtaining and confirming history, performing a directed evaluation, formulating and discussing my recommendations, and documenting the visit within the medical record.    Thank you for involving me in the care of this patient.      Carl Butner K. Myer Haff MD, [facility] Neurosurgery

## 2022-06-07 ENCOUNTER — Encounter: Payer: Self-pay | Admitting: Neurosurgery

## 2022-06-07 ENCOUNTER — Ambulatory Visit (INDEPENDENT_AMBULATORY_CARE_PROVIDER_SITE_OTHER): Payer: BC Managed Care – PPO | Admitting: Neurosurgery

## 2022-06-07 VITALS — BP 160/84 | Ht 65.0 in | Wt 160.6 lb

## 2022-06-07 DIAGNOSIS — G8929 Other chronic pain: Secondary | ICD-10-CM | POA: Diagnosis not present

## 2022-06-07 DIAGNOSIS — E1165 Type 2 diabetes mellitus with hyperglycemia: Secondary | ICD-10-CM

## 2022-06-07 DIAGNOSIS — M5117 Intervertebral disc disorders with radiculopathy, lumbosacral region: Secondary | ICD-10-CM | POA: Diagnosis not present

## 2022-06-07 DIAGNOSIS — E1169 Type 2 diabetes mellitus with other specified complication: Secondary | ICD-10-CM

## 2022-06-07 DIAGNOSIS — M5416 Radiculopathy, lumbar region: Secondary | ICD-10-CM

## 2022-06-17 ENCOUNTER — Encounter: Payer: Self-pay | Admitting: Neurosurgery

## 2022-07-22 ENCOUNTER — Other Ambulatory Visit: Payer: Self-pay

## 2022-07-22 DIAGNOSIS — Z01818 Encounter for other preprocedural examination: Secondary | ICD-10-CM

## 2022-08-05 ENCOUNTER — Encounter
Admission: RE | Admit: 2022-08-05 | Discharge: 2022-08-05 | Disposition: A | Discharge status: Home / Self Care | Payer: BC Managed Care – PPO | Source: Ambulatory Visit | Attending: Neurosurgery | Admitting: Neurosurgery

## 2022-08-05 VITALS — BP 143/80 | HR 74 | Temp 98.0°F | Resp 18 | Ht 65.0 in | Wt 158.9 lb

## 2022-08-05 DIAGNOSIS — Z01810 Encounter for preprocedural cardiovascular examination: Secondary | ICD-10-CM | POA: Diagnosis not present

## 2022-08-05 DIAGNOSIS — E139 Other specified diabetes mellitus without complications: Secondary | ICD-10-CM | POA: Insufficient documentation

## 2022-08-05 DIAGNOSIS — Z01812 Encounter for preprocedural laboratory examination: Secondary | ICD-10-CM

## 2022-08-05 DIAGNOSIS — Z01818 Encounter for other preprocedural examination: Secondary | ICD-10-CM | POA: Diagnosis present

## 2022-08-05 HISTORY — DX: Hypomagnesemia: E83.42

## 2022-08-05 HISTORY — DX: Spinal stenosis, site unspecified: M48.00

## 2022-08-05 HISTORY — DX: Calculus of kidney: N20.0

## 2022-08-05 LAB — TYPE AND SCREEN
ABO/RH(D): B POS
Antibody Screen: NEGATIVE

## 2022-08-05 LAB — URINALYSIS, ROUTINE W REFLEX MICROSCOPIC
Bacteria, UA: NONE SEEN
Bilirubin Urine: NEGATIVE
Glucose, UA: NEGATIVE mg/dL
Hgb urine dipstick: NEGATIVE
Ketones, ur: NEGATIVE mg/dL
Leukocytes,Ua: NEGATIVE
Nitrite: NEGATIVE
Protein, ur: 100 mg/dL — AB
Specific Gravity, Urine: 1.016 (ref 1.005–1.030)
Squamous Epithelial / HPF: NONE SEEN /HPF (ref 0–5)
pH: 5 (ref 5.0–8.0)

## 2022-08-05 LAB — SURGICAL PCR SCREEN
MRSA, PCR: NEGATIVE
Staphylococcus aureus: NEGATIVE

## 2022-08-05 NOTE — Patient Instructions (Addendum)
Your procedure is scheduled on: Wednesday 17 Report to the Registration Desk on the 1st floor of the [facility]. To find out your arrival time, please call [phone] between 1PM - 3PM on:  Tuesday 16 If your arrival time is 6:00 am, do not arrive before that time as the [facility] entrance doors do not open until 6:00 am.  REMEMBER: Instructions that are not followed completely may result in serious medical risk, up to and including death; or upon the discretion of your surgeon and anesthesiologist your surgery may need to be rescheduled.  Do not eat food after midnight the night before surgery.  No gum chewing or hard candies.  You may however, drink WATER up to 2 hours before you are scheduled to arrive for your surgery. Do not drink anything within 2 hours of your scheduled arrival time.   One week prior to surgery: Stop ANY OVER THE COUNTER supplements until after surgery.  You may however, continue to take Tylenol if needed for pain up until the day of surgery.  Continue taking all prescribed medications with the exception of the following: Aspirin: stop aspirin 7 days prior to surgery. Last dose Tuesday July 9.  Metformin: stop 2 days prior to surgery. Last dose Sunday July 14 OZEMPIC : Stop prior to surgery, last dose today Friday July 5     TAKE ONLY THESE MEDICATIONS THE MORNING OF SURGERY WITH A SIP OF WATER:  metoprolol succinate  amLODipine (NORVASC)  fenofibrate  omeprazole (PRILOSEC Antacid (take one the night before and one on the morning of surgery - helps to prevent nausea after surgery.) buPROPion (WELLBUTRIN )   No Alcohol for 24 hours before or after surgery.  No Smoking including e-cigarettes for 24 hours before surgery.  No chewable tobacco products for at least 6 hours before surgery.  No nicotine patches on the day of surgery.  Do not use any "recreational" drugs for at least a week (preferably 2 weeks) before your surgery.  Please be  advised that the combination of cocaine and anesthesia may have negative outcomes, up to and including death. If you test positive for cocaine, your surgery will be cancelled.  On the morning of surgery brush your teeth with toothpaste and water, you may rinse your mouth with mouthwash if you wish. Do not swallow any toothpaste or mouthwash.  Use CHG Soap as directed on instruction sheet.  Do not wear jewelry, make-up, hairpins, clips or nail polish.  Do not wear lotions, powders, or perfumes.   Do not shave body hair from the neck down 48 hours before surgery.  Contact lenses, hearing aids and dentures may not be worn into surgery.  Do not bring valuables to the hospital. [facility] is not responsible for any missing/lost belongings or valuables.   Notify your doctor if there is any change in your medical condition (cold, fever, infection).  Wear comfortable clothing (specific to your surgery type) to the hospital.  After surgery, you can help prevent lung complications by doing breathing exercises.  Take deep breaths and cough every 1-2 hours.   If you are being admitted to the hospital overnight, leave your suitcase in the car. After surgery it may be brought to your room.  In case of increased patient census, it may be necessary for you, the patient, to continue your postoperative care in the Same Day Surgery department.  If you are being discharged the day of surgery, you will not be allowed to drive home.  You will need a responsible individual to drive you home and stay with you for 24 hours after surgery.   If you are taking public transportation, you will need to have a responsible individual with you.  Please call the Pre-admissions Testing Dept. at [phone] if you have any questions about these instructions.  Surgery Visitation Policy:  Patients having surgery or a procedure may have two visitors.  Children under the age of 65 must have an adult with them who  is not the patient.  Inpatient Visitation:    Visiting hours are 7 a.m. to 8 p.m. Up to four visitors are allowed at one time in a patient room. The visitors may rotate out with other people during the day.  One visitor age 22 or older may stay with the patient overnight and must be in the room by 8 p.m.    Pre-operative 5 CHG Bath Instructions   You can play a key role in reducing the risk of infection after surgery. Your skin needs to be as free of germs as possible. You can reduce the number of germs on your skin by washing with CHG (chlorhexidine gluconate) soap before surgery. CHG is an antiseptic soap that kills germs and continues to kill germs even after washing.   DO NOT use if you have an allergy to chlorhexidine/CHG or antibacterial soaps. If your skin becomes reddened or irritated, stop using the CHG and notify one of our RNs at [phone].   Please shower with the CHG soap starting 4 days before surgery using the following schedule:     Please keep in mind the following:  DO NOT shave, including legs and underarms, starting the day of your first shower.   You may shave your face at any point before/day of surgery.  Place clean sheets on your bed the day you start using CHG soap. Use a clean washcloth (not used since being washed) for each shower. DO NOT sleep with pets once you start using the CHG.   CHG Shower Instructions:  If you choose to wash your hair and private area, wash first with your normal shampoo/soap.  After you use shampoo/soap, rinse your hair and body thoroughly to remove shampoo/soap residue.  Turn the water OFF and apply about 3 tablespoons (45 ml) of CHG soap to a CLEAN washcloth.  Apply CHG soap ONLY FROM YOUR NECK DOWN TO YOUR TOES (washing for 3-5 minutes)  DO NOT use CHG soap on face, private areas, open wounds, or sores.  Pay special attention to the area where your surgery is being performed.  If you are having back surgery, having someone  wash your back for you may be helpful. Wait 2 minutes after CHG soap is applied, then you may rinse off the CHG soap.  Pat dry with a clean towel  Put on clean clothes/pajamas   If you choose to wear lotion, please use ONLY the CHG-compatible lotions on the back of this paper.     Additional instructions for the day of surgery: DO NOT APPLY any lotions, deodorants, cologne, or perfumes.   Put on clean/comfortable clothes.  Brush your teeth.  Ask your nurse before applying any prescription medications to the skin.      CHG Compatible Lotions   Aveeno Moisturizing lotion  Cetaphil Moisturizing Cream  Cetaphil Moisturizing Lotion  Clairol Herbal Essence Moisturizing Lotion, Dry Skin  Clairol Herbal Essence Moisturizing Lotion, Extra Dry Skin  Clairol Herbal Essence Moisturizing Lotion, Normal Skin  Curel  Age Defying Therapeutic Moisturizing Lotion with Alpha Hydroxy  Curel Extreme Care Body Lotion  Curel Soothing Hands Moisturizing Hand Lotion  Curel Therapeutic Moisturizing Cream, Fragrance-Free  Curel Therapeutic Moisturizing Lotion, Fragrance-Free  Curel Therapeutic Moisturizing Lotion, Original Formula  Eucerin Daily Replenishing Lotion  Eucerin Dry Skin Therapy Plus Alpha Hydroxy Crme  Eucerin Dry Skin Therapy Plus Alpha Hydroxy Lotion  Eucerin Original Crme  Eucerin Original Lotion  Eucerin Plus Crme Eucerin Plus Lotion  Eucerin TriLipid Replenishing Lotion  Keri Anti-Bacterial Hand Lotion  Keri Deep Conditioning Original Lotion Dry Skin Formula Softly Scented  Keri Deep Conditioning Original Lotion, Fragrance Free Sensitive Skin Formula  Keri Lotion Fast Absorbing Fragrance Free Sensitive Skin Formula  Keri Lotion Fast Absorbing Softly Scented Dry Skin Formula  Keri Original Lotion  Keri Skin Renewal Lotion Keri Silky Smooth Lotion  Keri Silky Smooth Sensitive Skin Lotion  Nivea Body Creamy Conditioning Oil  Nivea Body Extra Enriched TEFL teacher Moisturizing Lotion Nivea Crme  Nivea Skin Firming Lotion  NutraDerm 30 Skin Lotion  NutraDerm Skin Lotion  NutraDerm Therapeutic Skin Cream  NutraDerm Therapeutic Skin Lotion  ProShield Protective Hand Cream  Provon moisturizing lotion

## 2022-08-08 NOTE — Progress Notes (Signed)
  Perioperative Services Pre-Admission/Anesthesia Testing    Date: 08/08/22  Name: Johnathan Potts MRN:   [id number]  Re: GLP-1 clearance and provider recommendations   Planned Surgical Procedure(s):    Case: [id number] Date/Time: 08/17/22 1413   Procedures:      L4-5 LATERAL LUMBAR INTERBODY FUSION WITH POSTERIOR SPINAL FUSION     LEFT L5-S1 MICRODISCECTOMY (Left)     APPLICATION OF INTRAOPERATIVE CT SCAN   Anesthesia type: General   Pre-op diagnosis:      Chronic bilateral low back pain with bilateral sciatica  M54.42, M54.41, G89.29      Lumbar radiculopathy  M54.16   Location: [facility] OR ROOM 03 / [facility]   Surgeons: Venetia Night, MD      Clinical Notes:  Patient is scheduled for the above procedure with the indicated provider/surgeon. In review of his medication reconciliation it was noted that patient is on a prescribed GLP-1 medication. Per guidelines issued by the American Society of Anesthesiologists (ASA), it is recommended that these medications be held for 7 days prior to the patient undergoing any type of elective surgical procedure. The patient is taking the following GLP-1 medication:  [x]  SEMAGLUTIDE   []  EXENATIDE  []  LIRAGLUTIDE   []  LIXISENATIDE  []  DULAGLUTIDE     []  TIRZEPATIDE (GLP-1/GIP)  Reached out to prescribing provider Terance Hart, MD) to make them aware of the guidelines from anesthesia. Given that this patient takes the prescribed GLP-1 medication for his  diabetes diagnosis, rather than for weight loss, recommendations from the prescribing provider were solicited. Prescribing provider made aware of the following so that informed decision/POC can be developed for this patient that may be taking medications belonging to these drug classes:  Oral GLP-1 medications will be held 1 day prior to surgery.  Injectable GLP-1 medications will be held 7 days prior to surgery.  Metformin is routinely held 48 hours prior to surgery due to  renal concerns, potential need for contrasted imaging perioperatively, and the potential for tissue hypoxia leading to drug induced lactic acidosis.  All SGLT2i medications are held 72 hours prior to surgery as they can be associated with the increased potential for developing euglycemic diabetic ketoacidosis (EDKA).   Impression and Plan:  Johnathan Potts is on a prescribed GLP-1 medication, which induces the known side effect of decreased gastric emptying. Efforts are bring made to mitigate the risk of perioperative hyperglycemic events, as elevated blood glucose levels have been found to contribute to intra/postoperative complications. Additionally, hyperglycemic extremes can potentially necessitate the postponing of a patient's elective case in order to better optimize perioperative glycemic control, again with the aforementioned guidelines in place. With this in mind, recommendations have been sought from the prescribing provider, who has cleared patient to proceed with holding the prescribed GLP-1 as per the guidelines from the ASA.   Provider recommending: no further recommendations received from the prescribing provider.  Copy of signed clearance and recommendations placed on patient's chart for inclusion in their medical record and for review by the surgical/anesthetic team on the day of his procedure.   Quentin Mulling, MSN, APRN, FNP-C, CEN [facility]  Peri-operative Services Nurse Practitioner Phone: [phone] 08/08/22 11:21 AM  NOTE: This note has been prepared using Dragon dictation software. Despite my best ability to proofread, there is always the potential that unintentional transcriptional errors may still occur from this process.

## 2022-08-12 LAB — NICOTINE/COTININE METABOLITES
Cotinine: <1 ng/mL
Nicotine: <1 ng/mL

## 2022-08-17 ENCOUNTER — Inpatient Hospital Stay: Payer: BC Managed Care – PPO | Admitting: Urgent Care

## 2022-08-17 ENCOUNTER — Encounter
Admission: RE | Disposition: A | Discharge status: Home / Self Care | Payer: Self-pay | Source: Home / Self Care | Attending: Neurosurgery

## 2022-08-17 ENCOUNTER — Other Ambulatory Visit: Payer: Self-pay

## 2022-08-17 ENCOUNTER — Inpatient Hospital Stay: Payer: BC Managed Care – PPO

## 2022-08-17 ENCOUNTER — Encounter: Payer: Self-pay | Admitting: Neurosurgery

## 2022-08-17 ENCOUNTER — Inpatient Hospital Stay
Admission: RE | Admit: 2022-08-17 | Discharge: 2022-08-18 | DRG: 455 | Disposition: A | Discharge status: Home / Self Care | Payer: BC Managed Care – PPO | Attending: Neurosurgery | Admitting: Neurosurgery

## 2022-08-17 DIAGNOSIS — G8929 Other chronic pain: Secondary | ICD-10-CM | POA: Diagnosis present

## 2022-08-17 DIAGNOSIS — Z01818 Encounter for other preprocedural examination: Secondary | ICD-10-CM

## 2022-08-17 DIAGNOSIS — Z87891 Personal history of nicotine dependence: Secondary | ICD-10-CM | POA: Diagnosis not present

## 2022-08-17 DIAGNOSIS — Z87442 Personal history of urinary calculi: Secondary | ICD-10-CM

## 2022-08-17 DIAGNOSIS — Z981 Arthrodesis status: Secondary | ICD-10-CM

## 2022-08-17 DIAGNOSIS — M5441 Lumbago with sciatica, right side: Secondary | ICD-10-CM | POA: Diagnosis present

## 2022-08-17 DIAGNOSIS — F1020 Alcohol dependence, uncomplicated: Secondary | ICD-10-CM | POA: Diagnosis present

## 2022-08-17 DIAGNOSIS — M48061 Spinal stenosis, lumbar region without neurogenic claudication: Secondary | ICD-10-CM | POA: Diagnosis present

## 2022-08-17 DIAGNOSIS — M5416 Radiculopathy, lumbar region: Principal | ICD-10-CM | POA: Diagnosis present

## 2022-08-17 DIAGNOSIS — M5442 Lumbago with sciatica, left side: Secondary | ICD-10-CM | POA: Diagnosis present

## 2022-08-17 DIAGNOSIS — I10 Essential (primary) hypertension: Secondary | ICD-10-CM | POA: Diagnosis present

## 2022-08-17 DIAGNOSIS — E1165 Type 2 diabetes mellitus with hyperglycemia: Secondary | ICD-10-CM | POA: Diagnosis present

## 2022-08-17 DIAGNOSIS — E139 Other specified diabetes mellitus without complications: Secondary | ICD-10-CM

## 2022-08-17 DIAGNOSIS — Z833 Family history of diabetes mellitus: Secondary | ICD-10-CM | POA: Diagnosis not present

## 2022-08-17 DIAGNOSIS — E785 Hyperlipidemia, unspecified: Secondary | ICD-10-CM | POA: Diagnosis present

## 2022-08-17 HISTORY — PX: ANTERIOR LATERAL LUMBAR FUSION WITH PERCUTANEOUS SCREW 1 LEVEL: SHX5553

## 2022-08-17 HISTORY — PX: LUMBAR LAMINECTOMY/DECOMPRESSION MICRODISCECTOMY: SHX5026

## 2022-08-17 HISTORY — PX: APPLICATION OF INTRAOPERATIVE CT SCAN: SHX6668

## 2022-08-17 LAB — GLUCOSE, CAPILLARY
Glucose-Capillary: 117 mg/dL — ABNORMAL HIGH (ref 70–99)
Glucose-Capillary: 165 mg/dL — ABNORMAL HIGH (ref 70–99)

## 2022-08-17 SURGERY — ANTERIOR LATERAL LUMBAR FUSION WITH PERCUTANEOUS SCREW 1 LEVEL
Anesthesia: General | Site: Spine Lumbar

## 2022-08-17 MED ORDER — PROPOFOL 500 MG/50ML IV EMUL
INTRAVENOUS | Status: DC | PRN
  Administered 2022-08-17: 200 ug/kg/min via INTRAVENOUS

## 2022-08-17 MED ORDER — DOCUSATE SODIUM 100 MG PO CAPS
100.0000 mg | ORAL_CAPSULE | Freq: Two times a day (BID) | ORAL | Status: DC
  Administered 2022-08-17 – 2022-08-18 (×2): 100 mg via ORAL
  Filled 2022-08-17 (×2): qty 1

## 2022-08-17 MED ORDER — PANTOPRAZOLE SODIUM 40 MG PO TBEC
40.0000 mg | DELAYED_RELEASE_TABLET | ORAL | Status: DC
  Administered 2022-08-17 – 2022-08-18 (×2): 40 mg via ORAL
  Filled 2022-08-17 (×2): qty 1

## 2022-08-17 MED ORDER — SODIUM CHLORIDE 0.9% FLUSH: 3.0000 mL | INTRAVENOUS | Status: DC | PRN

## 2022-08-17 MED ORDER — PHENOL 1.4 % MT LIQD: 1.0000 | OROMUCOSAL | Status: DC | PRN

## 2022-08-17 MED ORDER — OXYCODONE HCL 5 MG PO TABS
10.0000 mg | ORAL_TABLET | ORAL | Status: DC | PRN
  Administered 2022-08-17: 10 mg via ORAL
  Filled 2022-08-17: qty 2

## 2022-08-17 MED ORDER — GABAPENTIN 300 MG PO CAPS
300.0000 mg | ORAL_CAPSULE | Freq: Every day | ORAL | Status: DC
  Administered 2022-08-17: 300 mg via ORAL
  Filled 2022-08-17: qty 1

## 2022-08-17 MED ORDER — SUCCINYLCHOLINE CHLORIDE 200 MG/10ML IV SOSY
PREFILLED_SYRINGE | INTRAVENOUS | Status: AC
  Filled 2022-08-17: qty 10

## 2022-08-17 MED ORDER — ONDANSETRON HCL 4 MG/2ML IJ SOLN
INTRAMUSCULAR | Status: AC
  Filled 2022-08-17: qty 2

## 2022-08-17 MED ORDER — BUPIVACAINE LIPOSOME 1.3 % IJ SUSP
INTRAMUSCULAR | Status: DC | PRN
  Administered 2022-08-17: 20 mL

## 2022-08-17 MED ORDER — CHLORHEXIDINE GLUCONATE 0.12 % MT SOLN
15.0000 mL | Freq: Once | OROMUCOSAL | Status: AC
  Administered 2022-08-17: 15 mL via OROMUCOSAL

## 2022-08-17 MED ORDER — OXYCODONE HCL 5 MG PO TABS
5.0000 mg | ORAL_TABLET | Freq: Once | ORAL | Status: AC | PRN
  Administered 2022-08-17: 5 mg via ORAL

## 2022-08-17 MED ORDER — FENTANYL CITRATE (PF) 100 MCG/2ML IJ SOLN
25.0000 ug | INTRAMUSCULAR | Status: DC | PRN
  Administered 2022-08-17: 50 ug via INTRAVENOUS
  Administered 2022-08-17 (×4): 25 ug via INTRAVENOUS

## 2022-08-17 MED ORDER — OXYCODONE HCL 5 MG PO TABS: 5.0000 mg | ORAL_TABLET | ORAL | Status: DC | PRN

## 2022-08-17 MED ORDER — OXYCODONE HCL 5 MG PO TABS
ORAL_TABLET | ORAL | Status: AC
  Filled 2022-08-17: qty 1

## 2022-08-17 MED ORDER — CEFAZOLIN SODIUM-DEXTROSE 2-4 GM/100ML-% IV SOLN
2.0000 g | Freq: Once | INTRAVENOUS | Status: AC
  Administered 2022-08-17: 2 g via INTRAVENOUS

## 2022-08-17 MED ORDER — BUPIVACAINE-EPINEPHRINE (PF) 0.5% -1:200000 IJ SOLN
INTRAMUSCULAR | Status: DC | PRN
  Administered 2022-08-17: 9 mL
  Administered 2022-08-17: 4 mL

## 2022-08-17 MED ORDER — SENNA 8.6 MG PO TABS
1.0000 | ORAL_TABLET | Freq: Two times a day (BID) | ORAL | Status: DC
  Administered 2022-08-17 – 2022-08-18 (×2): 8.6 mg via ORAL
  Filled 2022-08-17 (×2): qty 1

## 2022-08-17 MED ORDER — FENOFIBRATE 160 MG PO TABS
160.0000 mg | ORAL_TABLET | Freq: Every day | ORAL | Status: DC
  Administered 2022-08-17 – 2022-08-18 (×2): 160 mg via ORAL
  Filled 2022-08-17 (×2): qty 1

## 2022-08-17 MED ORDER — BISACODYL 10 MG RE SUPP: 10.0000 mg | Freq: Every day | RECTAL | Status: DC | PRN

## 2022-08-17 MED ORDER — ORAL CARE MOUTH RINSE: 15.0000 mL | Freq: Once | OROMUCOSAL | Status: AC

## 2022-08-17 MED ORDER — ONDANSETRON HCL 4 MG PO TABS: 4.0000 mg | ORAL_TABLET | Freq: Four times a day (QID) | ORAL | Status: DC | PRN

## 2022-08-17 MED ORDER — FENTANYL CITRATE (PF) 100 MCG/2ML IJ SOLN
INTRAMUSCULAR | Status: AC
  Filled 2022-08-17: qty 2

## 2022-08-17 MED ORDER — REMIFENTANIL HCL 1 MG IV SOLR
INTRAVENOUS | Status: DC | PRN
  Administered 2022-08-17: .1 ug/kg/min via INTRAVENOUS

## 2022-08-17 MED ORDER — MAGNESIUM CITRATE PO SOLN: 1.0000 | Freq: Once | ORAL | Status: DC | PRN

## 2022-08-17 MED ORDER — SODIUM CHLORIDE 0.9% FLUSH
3.0000 mL | Freq: Two times a day (BID) | INTRAVENOUS | Status: DC
  Administered 2022-08-17 – 2022-08-18 (×2): 3 mL via INTRAVENOUS

## 2022-08-17 MED ORDER — LISINOPRIL 20 MG PO TABS
20.0000 mg | ORAL_TABLET | Freq: Two times a day (BID) | ORAL | Status: DC
  Administered 2022-08-17 – 2022-08-18 (×2): 20 mg via ORAL
  Filled 2022-08-17 (×2): qty 1

## 2022-08-17 MED ORDER — ACETAMINOPHEN 500 MG PO TABS
1000.0000 mg | ORAL_TABLET | Freq: Four times a day (QID) | ORAL | Status: DC
  Administered 2022-08-17 – 2022-08-18 (×3): 1000 mg via ORAL
  Filled 2022-08-17 (×3): qty 2

## 2022-08-17 MED ORDER — FENTANYL CITRATE (PF) 100 MCG/2ML IJ SOLN
INTRAMUSCULAR | Status: DC | PRN
  Administered 2022-08-17 (×2): 50 ug via INTRAVENOUS

## 2022-08-17 MED ORDER — PHENYLEPHRINE HCL-NACL 20-0.9 MG/250ML-% IV SOLN
INTRAVENOUS | Status: AC
  Filled 2022-08-17: qty 250

## 2022-08-17 MED ORDER — MIDAZOLAM HCL 2 MG/2ML IJ SOLN
INTRAMUSCULAR | Status: DC | PRN
  Administered 2022-08-17: 2 mg via INTRAVENOUS

## 2022-08-17 MED ORDER — METFORMIN HCL ER 750 MG PO TB24
750.0000 mg | ORAL_TABLET | Freq: Two times a day (BID) | ORAL | Status: DC
  Administered 2022-08-17 – 2022-08-18 (×2): 750 mg via ORAL
  Filled 2022-08-17 (×2): qty 1

## 2022-08-17 MED ORDER — CEFAZOLIN SODIUM-DEXTROSE 2-4 GM/100ML-% IV SOLN
INTRAVENOUS | Status: AC
  Filled 2022-08-17: qty 100

## 2022-08-17 MED ORDER — SODIUM CHLORIDE 0.9 % IV SOLN: INTRAVENOUS | Status: DC

## 2022-08-17 MED ORDER — HYDROMORPHONE HCL 1 MG/ML IJ SOLN
INTRAMUSCULAR | Status: DC | PRN
  Administered 2022-08-17: .5 mg via INTRAVENOUS
  Administered 2022-08-17: .25 mg via INTRAVENOUS

## 2022-08-17 MED ORDER — DIAZEPAM 2 MG PO TABS
2.0000 mg | ORAL_TABLET | Freq: Three times a day (TID) | ORAL | Status: DC
  Administered 2022-08-17 – 2022-08-18 (×3): 2 mg via ORAL
  Filled 2022-08-17 (×3): qty 1

## 2022-08-17 MED ORDER — PHENYLEPHRINE 80 MCG/ML (10ML) SYRINGE FOR IV PUSH (FOR BLOOD PRESSURE SUPPORT)
PREFILLED_SYRINGE | INTRAVENOUS | Status: DC | PRN
  Administered 2022-08-17 (×2): 80 ug via INTRAVENOUS
  Administered 2022-08-17: 160 ug via INTRAVENOUS
  Administered 2022-08-17 (×2): 80 ug via INTRAVENOUS
  Administered 2022-08-17: 160 ug via INTRAVENOUS
  Administered 2022-08-17: 80 ug via INTRAVENOUS

## 2022-08-17 MED ORDER — SODIUM CHLORIDE 0.9 % IV SOLN: 250.0000 mL | INTRAVENOUS | Status: DC

## 2022-08-17 MED ORDER — SODIUM CHLORIDE (PF) 0.9 % IJ SOLN
INTRAMUSCULAR | Status: DC | PRN
  Administered 2022-08-17: 20 mL via INTRAVENOUS

## 2022-08-17 MED ORDER — LIDOCAINE HCL (CARDIAC) PF 100 MG/5ML IV SOSY
PREFILLED_SYRINGE | INTRAVENOUS | Status: DC | PRN
  Administered 2022-08-17: 80 mg via INTRAVENOUS

## 2022-08-17 MED ORDER — PROPOFOL 1000 MG/100ML IV EMUL
INTRAVENOUS | Status: AC
  Filled 2022-08-17: qty 100

## 2022-08-17 MED ORDER — VANCOMYCIN HCL IN DEXTROSE 1-5 GM/200ML-% IV SOLN
1000.0000 mg | Freq: Once | INTRAVENOUS | Status: AC
  Administered 2022-08-17: 1000 mg via INTRAVENOUS

## 2022-08-17 MED ORDER — CHLORHEXIDINE GLUCONATE 0.12 % MT SOLN
OROMUCOSAL | Status: AC
  Filled 2022-08-17: qty 15

## 2022-08-17 MED ORDER — PHENYLEPHRINE HCL-NACL 20-0.9 MG/250ML-% IV SOLN
INTRAVENOUS | Status: DC | PRN
  Administered 2022-08-17: 30 ug/min via INTRAVENOUS

## 2022-08-17 MED ORDER — ACETAMINOPHEN 10 MG/ML IV SOLN
INTRAVENOUS | Status: DC | PRN
  Administered 2022-08-17: 1000 mg via INTRAVENOUS

## 2022-08-17 MED ORDER — SUCCINYLCHOLINE CHLORIDE 200 MG/10ML IV SOSY
PREFILLED_SYRINGE | INTRAVENOUS | Status: DC | PRN
  Administered 2022-08-17: 100 mg via INTRAVENOUS

## 2022-08-17 MED ORDER — BUPIVACAINE HCL (PF) 0.5 % IJ SOLN
INTRAMUSCULAR | Status: DC | PRN
  Administered 2022-08-17: 20 mL

## 2022-08-17 MED ORDER — SURGIFLO WITH THROMBIN (HEMOSTATIC MATRIX KIT) OPTIME
TOPICAL | Status: DC | PRN
  Administered 2022-08-17: 1 via TOPICAL

## 2022-08-17 MED ORDER — MIDAZOLAM HCL 2 MG/2ML IJ SOLN
INTRAMUSCULAR | Status: AC
  Filled 2022-08-17: qty 2

## 2022-08-17 MED ORDER — OMEPRAZOLE MAGNESIUM 20 MG PO TBEC: 20.0000 mg | DELAYED_RELEASE_TABLET | ORAL | Status: DC

## 2022-08-17 MED ORDER — SURGIPHOR WOUND IRRIGATION SYSTEM - OPTIME
TOPICAL | Status: DC | PRN
  Administered 2022-08-17: 450 mL

## 2022-08-17 MED ORDER — BUPIVACAINE HCL (PF) 0.5 % IJ SOLN
INTRAMUSCULAR | Status: AC
  Filled 2022-08-17: qty 30

## 2022-08-17 MED ORDER — DEXMEDETOMIDINE HCL IN NACL 80 MCG/20ML IV SOLN
INTRAVENOUS | Status: DC | PRN
  Administered 2022-08-17: 12 ug via INTRAVENOUS
  Administered 2022-08-17: 8 ug via INTRAVENOUS

## 2022-08-17 MED ORDER — REMIFENTANIL HCL 1 MG IV SOLR
INTRAVENOUS | Status: AC
  Filled 2022-08-17: qty 1000

## 2022-08-17 MED ORDER — SODIUM CHLORIDE FLUSH 0.9 % IV SOLN
INTRAVENOUS | Status: AC
  Filled 2022-08-17: qty 20

## 2022-08-17 MED ORDER — HYDROCHLOROTHIAZIDE 25 MG PO TABS
25.0000 mg | ORAL_TABLET | Freq: Every day | ORAL | Status: DC
  Administered 2022-08-17 – 2022-08-18 (×2): 25 mg via ORAL
  Filled 2022-08-17 (×2): qty 1

## 2022-08-17 MED ORDER — HYDROMORPHONE HCL 1 MG/ML IJ SOLN
INTRAMUSCULAR | Status: AC
  Filled 2022-08-17: qty 1

## 2022-08-17 MED ORDER — VITAMIN B-12 1000 MCG PO TABS
500.0000 ug | ORAL_TABLET | Freq: Every day | ORAL | Status: DC
  Administered 2022-08-17 – 2022-08-18 (×2): 500 ug via ORAL
  Filled 2022-08-17 (×2): qty 1

## 2022-08-17 MED ORDER — POLYETHYLENE GLYCOL 3350 17 G PO PACK: 17.0000 g | PACK | Freq: Every day | ORAL | Status: DC | PRN

## 2022-08-17 MED ORDER — BUPROPION HCL ER (XL) 150 MG PO TB24
150.0000 mg | ORAL_TABLET | Freq: Every day | ORAL | Status: DC
  Administered 2022-08-18: 150 mg via ORAL
  Filled 2022-08-17: qty 1

## 2022-08-17 MED ORDER — MAGNESIUM OXIDE 400 MG PO TABS
400.0000 mg | ORAL_TABLET | Freq: Two times a day (BID) | ORAL | Status: DC
  Administered 2022-08-17 – 2022-08-18 (×2): 400 mg via ORAL
  Filled 2022-08-17 (×4): qty 1

## 2022-08-17 MED ORDER — ENOXAPARIN SODIUM 40 MG/0.4ML IJ SOSY
40.0000 mg | PREFILLED_SYRINGE | INTRAMUSCULAR | Status: DC
  Administered 2022-08-18: 40 mg via SUBCUTANEOUS
  Filled 2022-08-17: qty 0.4

## 2022-08-17 MED ORDER — LIDOCAINE HCL (PF) 2 % IJ SOLN
INTRAMUSCULAR | Status: AC
  Filled 2022-08-17: qty 5

## 2022-08-17 MED ORDER — ONDANSETRON HCL 4 MG/2ML IJ SOLN
INTRAMUSCULAR | Status: DC | PRN
  Administered 2022-08-17: 4 mg via INTRAVENOUS

## 2022-08-17 MED ORDER — ONDANSETRON HCL 4 MG/2ML IJ SOLN: 4.0000 mg | Freq: Four times a day (QID) | INTRAMUSCULAR | Status: DC | PRN

## 2022-08-17 MED ORDER — BUPIVACAINE LIPOSOME 1.3 % IJ SUSP
INTRAMUSCULAR | Status: AC
  Filled 2022-08-17: qty 20

## 2022-08-17 MED ORDER — DEXAMETHASONE SODIUM PHOSPHATE 10 MG/ML IJ SOLN
INTRAMUSCULAR | Status: AC
  Filled 2022-08-17: qty 1

## 2022-08-17 MED ORDER — METHOCARBAMOL 1000 MG/10ML IJ SOLN: 500.0000 mg | Freq: Four times a day (QID) | INTRAVENOUS | Status: DC | PRN

## 2022-08-17 MED ORDER — MENTHOL 3 MG MT LOZG: 1.0000 | LOZENGE | OROMUCOSAL | Status: DC | PRN

## 2022-08-17 MED ORDER — METHOCARBAMOL 500 MG PO TABS
500.0000 mg | ORAL_TABLET | Freq: Four times a day (QID) | ORAL | Status: DC | PRN
  Administered 2022-08-17: 500 mg via ORAL
  Filled 2022-08-17: qty 1

## 2022-08-17 MED ORDER — SIMVASTATIN 20 MG PO TABS
20.0000 mg | ORAL_TABLET | Freq: Every day | ORAL | Status: DC
  Administered 2022-08-17: 20 mg via ORAL
  Filled 2022-08-17: qty 1

## 2022-08-17 MED ORDER — OXYCODONE HCL 5 MG/5ML PO SOLN: 5.0000 mg | Freq: Once | ORAL | Status: AC | PRN

## 2022-08-17 MED ORDER — METOPROLOL SUCCINATE ER 50 MG PO TB24
100.0000 mg | ORAL_TABLET | Freq: Every day | ORAL | Status: DC
  Administered 2022-08-17 – 2022-08-18 (×2): 100 mg via ORAL
  Filled 2022-08-17 (×2): qty 2

## 2022-08-17 MED ORDER — AMLODIPINE BESYLATE 10 MG PO TABS
10.0000 mg | ORAL_TABLET | Freq: Every day | ORAL | Status: DC
  Administered 2022-08-17 – 2022-08-18 (×2): 10 mg via ORAL
  Filled 2022-08-17 (×2): qty 1

## 2022-08-17 MED ORDER — GLYCOPYRROLATE 0.2 MG/ML IJ SOLN
INTRAMUSCULAR | Status: DC | PRN
  Administered 2022-08-17 (×2): .1 mg via INTRAVENOUS

## 2022-08-17 MED ORDER — 0.9 % SODIUM CHLORIDE (POUR BTL) OPTIME
TOPICAL | Status: DC | PRN
  Administered 2022-08-17: 500 mL

## 2022-08-17 MED ORDER — PROPOFOL 10 MG/ML IV BOLUS
INTRAVENOUS | Status: DC | PRN
Start: 2022-08-17 — End: 2022-08-17
  Administered 2022-08-17: 200 mg via INTRAVENOUS
  Administered 2022-08-17: 100 mg via INTRAVENOUS

## 2022-08-17 MED ORDER — ACETAMINOPHEN 10 MG/ML IV SOLN
INTRAVENOUS | Status: AC
  Filled 2022-08-17: qty 100

## 2022-08-17 MED ORDER — VANCOMYCIN HCL IN DEXTROSE 1-5 GM/200ML-% IV SOLN
INTRAVENOUS | Status: AC
  Filled 2022-08-17: qty 200

## 2022-08-17 MED ORDER — HYDROMORPHONE HCL 2 MG PO TABS
2.0000 mg | ORAL_TABLET | ORAL | Status: DC | PRN
  Administered 2022-08-17 – 2022-08-18 (×2): 2 mg via ORAL
  Filled 2022-08-17 (×2): qty 1

## 2022-08-17 MED ORDER — KETOROLAC TROMETHAMINE 15 MG/ML IJ SOLN
15.0000 mg | Freq: Four times a day (QID) | INTRAMUSCULAR | Status: AC
  Administered 2022-08-17 – 2022-08-18 (×4): 15 mg via INTRAVENOUS
  Filled 2022-08-17 (×4): qty 1

## 2022-08-17 MED ORDER — DEXAMETHASONE SODIUM PHOSPHATE 10 MG/ML IJ SOLN
INTRAMUSCULAR | Status: DC | PRN
  Administered 2022-08-17: 10 mg via INTRAVENOUS

## 2022-08-17 SURGICAL SUPPLY — 90 items
ADH SKN CLS APL DERMABOND .7 (GAUZE/BANDAGES/DRESSINGS) ×9
AGENT HMST KT MTR STRL THRMB (HEMOSTASIS) ×3
ALLOGRAFT BONE FIBER KORE 5 (Bone Implant) IMPLANT
APL PRP STRL LF DISP 70% ISPRP (MISCELLANEOUS) ×3
BASIN KIT SINGLE STR (MISCELLANEOUS) ×3 IMPLANT
BUR NEURO DRILL SOFT 3.0X3.8M (BURR) ×3 IMPLANT
CHLORAPREP W/TINT 26 (MISCELLANEOUS) ×6 IMPLANT
CLIP NEUROVISION LG (NEUROSURGERY SUPPLIES) IMPLANT
CNTNR URN SCR LID CUP LEK RST (MISCELLANEOUS) ×3 IMPLANT
CONT SPEC 4OZ STRL OR WHT (MISCELLANEOUS) ×3
CORD BIP STRL DISP 12FT (MISCELLANEOUS) ×3 IMPLANT
CORD LIGHT LATERIAL X LIFT (MISCELLANEOUS) IMPLANT
COVER BACK TABLE REUSABLE LG (DRAPES) ×3 IMPLANT
COVERAGE SUPP BRAINLAB NG SPNE (MISCELLANEOUS) IMPLANT
COVERAGE SUPPORT SPINE BRAINLB (MISCELLANEOUS)
DERMABOND ADVANCED .7 DNX12 (GAUZE/BANDAGES/DRESSINGS) ×9 IMPLANT
DRAPE C ARM PK CFD 31 SPINE (DRAPES) ×3 IMPLANT
DRAPE C-ARMOR (DRAPES) IMPLANT
DRAPE INCISE IOBAN 66X45 STRL (DRAPES) ×3 IMPLANT
DRAPE LAPAROTOMY 100X77 ABD (DRAPES) ×6 IMPLANT
DRAPE MICROSCOPE SPINE 48X150 (DRAPES) IMPLANT
DRAPE POUCH INSTRU U-SHP 10X18 (DRAPES) ×3 IMPLANT
DRAPE SCAN PATIENT (DRAPES) ×3 IMPLANT
DRSG OPSITE POSTOP 4X6 (GAUZE/BANDAGES/DRESSINGS) IMPLANT
DRSG TEGADERM 2-3/8X2-3/4 SM (GAUZE/BANDAGES/DRESSINGS) IMPLANT
DRSG TEGADERM 4X4.75 (GAUZE/BANDAGES/DRESSINGS) IMPLANT
DRSG TEGADERM 6X8 (GAUZE/BANDAGES/DRESSINGS) IMPLANT
ELECT EZSTD 165MM 6.5IN (MISCELLANEOUS) ×3
ELECT REM PT RETURN 9FT ADLT (ELECTROSURGICAL) ×6
ELECTRODE EZSTD 165MM 6.5IN (MISCELLANEOUS) ×3 IMPLANT
ELECTRODE REM PT RTRN 9FT ADLT (ELECTROSURGICAL) ×6 IMPLANT
EX-PIN ORTHOLOCK NAV 4X150 (PIN) ×3 IMPLANT
FEE CVG SUPP BRAINLAB NG SPNE (MISCELLANEOUS) IMPLANT
FEE INTRAOP CADWELL SUPPLY NCS (MISCELLANEOUS) ×3 IMPLANT
FEE INTRAOP MONITOR IMPULS NCS (MISCELLANEOUS) ×3 IMPLANT
FORCEPS BPLR BAYO 10IN 1.0TIP (ORTHOPEDIC DISPOSABLE SUPPLIES) IMPLANT
GAUZE 4X4 16PLY ~~LOC~~+RFID DBL (SPONGE) IMPLANT
GAUZE SPONGE 2X2 STRL 8-PLY (GAUZE/BANDAGES/DRESSINGS) IMPLANT
GLOVE BIOGEL PI IND STRL 6.5 (GLOVE) ×9 IMPLANT
GLOVE SURG SYN 6.5 ES PF (GLOVE) ×15 IMPLANT
GLOVE SURG SYN 6.5 PF PI (GLOVE) ×15 IMPLANT
GLOVE SURG SYN 8.5 E (GLOVE) ×18 IMPLANT
GLOVE SURG SYN 8.5 PF PI (GLOVE) ×18 IMPLANT
GOWN SRG LRG LVL 4 IMPRV REINF (GOWNS) ×6 IMPLANT
GOWN SRG XL LVL 3 NONREINFORCE (GOWNS) ×6 IMPLANT
GOWN STRL NON-REIN TWL XL LVL3 (GOWNS) ×6
GOWN STRL REIN LRG LVL4 (GOWNS) ×6
GUIDEWIRE NITINOL BEVEL TIP (WIRE) IMPLANT
HOLDER FOLEY CATH W/STRAP (MISCELLANEOUS) ×3 IMPLANT
INTRAOP CADWELL SUPPLY FEE NCS (MISCELLANEOUS)
INTRAOP DISP SUPPLY FEE NCS (MISCELLANEOUS)
INTRAOP MONITOR FEE IMPULS NCS (MISCELLANEOUS)
KIT DILATOR XLIF 5 (KITS) IMPLANT
KIT DISP MARS 3V (KITS) IMPLANT
KIT SPINAL PRONEVIEW (KITS) ×3 IMPLANT
KIT TURNOVER KIT A (KITS) ×3 IMPLANT
KNIFE BAYONET SHORT DISCETOMY (MISCELLANEOUS) IMPLANT
MANIFOLD NEPTUNE II (INSTRUMENTS) ×6 IMPLANT
MARKER SKIN DUAL TIP RULER LAB (MISCELLANEOUS) ×6 IMPLANT
MARKER SPHERE PSV REFLC 13MM (MARKER) ×21 IMPLANT
MODULE NVM5 NEXT GEN EMG (NEUROSURGERY SUPPLIES) IMPLANT
NDL SAFETY ECLIP 18X1.5 (MISCELLANEOUS) ×3 IMPLANT
NS IRRIG 1000ML POUR BTL (IV SOLUTION) ×3 IMPLANT
NS IRRIG 500ML POUR BTL (IV SOLUTION) IMPLANT
PACK LAMINECTOMY ARMC (PACKS) ×3 IMPLANT
PAD ARMBOARD 7.5X6 YLW CONV (MISCELLANEOUS) ×3 IMPLANT
PENCIL SMOKE EVACUATOR (MISCELLANEOUS) ×3 IMPLANT
ROD RELINE MAS LORD 5.5X45MM (Rod) IMPLANT
ROD RELINE MAS LORD 5.5X50 (Rod) IMPLANT
SCREW LOCK RELINE 5.5 TULIP (Screw) IMPLANT
SCREW RELINE RED 6.5X45MM POLY (Screw) IMPLANT
SOLUTION IRRIG SURGIPHOR (IV SOLUTION) ×3 IMPLANT
SPACER HEDRON 18X50X9 10D (Spacer) IMPLANT
STAPLER SKIN PROX 35W (STAPLE) IMPLANT
SURGIFLO W/THROMBIN 8M KIT (HEMOSTASIS) ×3 IMPLANT
SUT DVC VLOC 3-0 CL 6 P-12 (SUTURE) ×3 IMPLANT
SUT ETHILON 3-0 FS-10 30 BLK (SUTURE)
SUT VIC AB 0 CT1 27 (SUTURE) ×9
SUT VIC AB 0 CT1 27XCR 8 STRN (SUTURE) ×3 IMPLANT
SUT VIC AB 2-0 CT1 18 (SUTURE) ×3 IMPLANT
SUTURE EHLN 3-0 FS-10 30 BLK (SUTURE) IMPLANT
SYR 30ML LL (SYRINGE) ×6 IMPLANT
SYR 3ML LL SCALE MARK (SYRINGE) ×3 IMPLANT
TAPE CLOTH 3X10 WHT NS LF (GAUZE/BANDAGES/DRESSINGS) ×12 IMPLANT
TOWEL OR 17X26 4PK STRL BLUE (TOWEL DISPOSABLE) ×6 IMPLANT
TRAP FLUID SMOKE EVACUATOR (MISCELLANEOUS) ×3 IMPLANT
TRAY FOLEY SLVR 16FR LF STAT (SET/KITS/TRAYS/PACK) IMPLANT
TUBING CONNECTING 10 (TUBING) ×3 IMPLANT
WATER STERILE IRR 1000ML POUR (IV SOLUTION) ×6 IMPLANT
WATER STERILE IRR 500ML POUR (IV SOLUTION) IMPLANT

## 2022-08-17 NOTE — Transfer of Care (Signed)
Immediate Anesthesia Transfer of Care Note  Patient: Johnathan Potts  Procedure(s) Performed: L4-5 LATERAL LUMBAR INTERBODY FUSION WITH POSTERIOR SPINAL FUSION (Spine Lumbar) LEFT L5-S1 MICRODISCECTOMY (Left: Spine Lumbar) APPLICATION OF INTRAOPERATIVE CT SCAN  Patient Location: PACU  Anesthesia Type:General  Level of Consciousness: drowsy  Airway & Oxygen Therapy: Patient Spontanous Breathing and Patient connected to face mask oxygen  Post-op Assessment: Report given to RN and Post -op Vital signs reviewed and stable  Post vital signs: Reviewed and stable  Last Vitals:  Vitals Value Taken Time  BP 121/65 08/17/22 1437  Temp    Pulse 79 08/17/22 1440  Resp 19 08/17/22 1440  SpO2 100 % 08/17/22 1440  Vitals shown include unfiled device data.  Last Pain:  Vitals:   08/17/22 0920  TempSrc: Tympanic  PainSc: 3          Complications: No notable events documented.

## 2022-08-17 NOTE — H&P (Signed)
Referring Physician:  No referring provider defined for this encounter.  Primary Physician:  Dorothey Baseman, MD  History of Present Illness: 08/17/2022 Johnathan Potts continues to have back and leg pain.  06/07/2022 Johnathan Potts is here today with a chief complaint of back and leg pain.  He has pain into both legs that is sometimes worse on the right than the left.  He has tingling and numbness in his left leg.  Is been ongoing worsening over the past 6 months.  His pain can be as bad as 10 out of 10 in his buttocks and lower back.  Standing, walking, twisting, and laying down makes it worse.  Standing and changing positions or sitting down makes it better.  He tried physical therapy but his pain did not improve.  He was discharged.   Bowel/Bladder Dysfunction: none  Conservative measures:  Physical therapy:  has participated in at [facility] from 01/27/22 to 02/23/22  Multimodal medical therapy including regular antiinflammatories:  gabapentin, meloxicam, flexeril  Injections:  has received epidural steroid injections 06/03/22: Bilateral L3 and L4 medial branch and L5 dorsal ramus blocks  05/06/22: Bilateral L5-S1 TF ESI (relief x1 week) 03/31/22: Left L5-S1 and Left S1 TF ESI (temporary relief) 08/14/20: Bilateral L4-5 and L5-S1 facet joint injections (75% improvement)   Past Surgery:  12/30/19: L4-5 Laminectomy by Dr. [name] has no symptoms of cervical myelopathy.  The symptoms are causing a significant impact on the patient's life.   I have utilized the care everywhere function in epic to review the outside records available from external health systems.  Review of Systems:  A 10 point review of systems is negative, except for the pertinent positives and negatives detailed in the HPI.  Past Medical History: Past Medical History:  Diagnosis Date   Diabetes mellitus without complication (HCC)    Type 2   Erectile dysfunction    History of kidney stones     2006- lithotripsy   Hyperlipidemia    Hypertension    Hypomagnesemia    Nephrolithiasis    Spinal stenosis     Past Surgical History: Past Surgical History:  Procedure Laterality Date   APPENDECTOMY     2001   COLONOSCOPY  09/15/2021   ESOPHAGOGASTRODUODENOSCOPY  09/15/2021   HAND SURGERY Left    KIDNEY STONE SURGERY     LASIK Bilateral 2006   [facility]   LUMBAR LAMINECTOMY/DECOMPRESSION MICRODISCECTOMY N/A 12/30/2019   Procedure: OPEN L4/5 LUMBAR LAMINECTOMY;  Surgeon: Lucy Chris, MD;  Location: [facility] ORS;  Service: Neurosurgery;  Laterality: N/A;    Allergies: Allergies as of 07/22/2022   (No Known Allergies)    Medications:  Current Facility-Administered Medications:    0.9 %  sodium chloride infusion, , Intravenous, Continuous, Lenard Simmer, MD, Last Rate: 10 mL/hr at 08/17/22 0925, New Bag at 08/17/22 0925   ceFAZolin (ANCEF) IVPB 2g/100 mL premix, 2 g, Intravenous, Once, Venetia Night, MD   vancomycin (VANCOCIN) IVPB 1000 mg/200 mL premix, 1,000 mg, Intravenous, Once, Venetia Night, MD  Social History: Social History   Tobacco Use   Smoking status: Former    Current packs/day: 0.00    Average packs/day: 1 pack/day for 35.0 years (35.0 ttl pk-yrs)    Types: Cigarettes    Start date: 06/16/1987    Quit date: 06/16/2022    Years since quitting: 0.1   Smokeless tobacco: Never  Vaping Use   Vaping status: Never Used  Substance Use Topics  Alcohol use: Yes    Alcohol/week: 15.0 standard drinks of alcohol    Types: 15 Shots of liquor per week    Comment: daily   Drug use: Never    Family Medical History: Family History  Problem Relation Age of Onset   Diabetes Mother    Diabetes Father    Diabetes Brother     Physical Examination: Vitals:   08/17/22 0920  BP: (!) 162/79  Pulse: 89  Resp: 18  Temp: 98.7 F (37.1 C)  SpO2: 100%   Heart sounds normal no MRG. Chest Clear to Auscultation Bilaterally.  General: Patient is  in no apparent distress. Attention to examination is appropriate.  Neck:   Supple.  Full range of motion.  Respiratory: Patient is breathing without any difficulty.   NEUROLOGICAL:     Awake, alert, oriented to person, place, and time.  Speech is clear and fluent.   Cranial Nerves: Pupils equal round and reactive to light.  Facial tone is symmetric.  Facial sensation is symmetric. Shoulder shrug is symmetric. Tongue protrusion is midline.  There is no pronator drift.  Strength: Side Biceps Triceps Deltoid Interossei Grip Wrist Ext. Wrist Flex.  R 5 5 5 5 5 5 5   L 5 5 5 5 5 5 5    Side Iliopsoas Quads Hamstring PF DF EHL  R 5 5 5 5 5 5   L 5 5 5 5 5 5    Reflexes are 1+ and symmetric at the biceps, triceps, brachioradialis, patella and achilles.   Hoffman's is absent.   Bilateral upper and lower extremity sensation is intact to light touch.    No evidence of dysmetria noted.  Gait is antalgic.  Straight leg raise is positive at 60 degrees.     Medical Decision Making  Imaging: MRI L spine 03/21/2022 L4-L5: Prior posterior decompression. Prominent diffuse disc bulging and endplate spurring. Moderate right and mild left facet arthropathy with ligamentum flavum hypertrophy. Severe spinal canal stenosis. Severe right and moderate left neuroforaminal stenosis.   L5-S1: Mild disc bulging with superimposed down turning left subarticular disc protrusion with impingement of the descending left S1 nerve root. Mild bilateral facet arthropathy. Mild spinal canal and bilateral neuroforaminal stenosis.   IMPRESSION: 1. Multilevel degenerative changes of the lumbar spine as described above. Severe spinal canal and right neuroforaminal stenosis at L4-L5. 2. Left subarticular disc protrusion at L5-S1 with impingement of the descending left S1 nerve root.     Electronically Signed   By: Obie Dredge M.D.   On: 03/22/2022 15:01  I have personally reviewed the images and agree with  the above interpretation.  Assessment and Plan: Johnathan Potts is a pleasant 52 y.o. male with low back pain with bilateral sciatica.  We will procee dwith L4-5 lateral lumbar interbody fusion with left-sided L5-S1 microdiscectomy.        Delesia Martinek K. Myer Haff MD, [facility] Neurosurgery

## 2022-08-17 NOTE — Plan of Care (Signed)

## 2022-08-17 NOTE — Op Note (Signed)
Indications: [name] is a 52 y.o. male with Chronic bilateral low back pain with bilateral sciatica M54.42, M54.41, G89.29 , Lumbar radiculopathy M54.16   Findings: expansion of disc space  Preoperative Diagnosis: Chronic bilateral low back pain with bilateral sciatica M54.42, M54.41, G89.29 , Lumbar radiculopathy M54.16  Postoperative Diagnosis: same   EBL: 50 ml IVF: see anesthesia record Drains: none Disposition: Extubated and Stable to PACU Complications: none  A foley catheter was placed.   Preoperative Note:   Risks of surgery discussed include: infection, bleeding, stroke, coma, death, paralysis, CSF leak, nerve/spinal cord injury, numbness, tingling, weakness, complex regional pain syndrome, recurrent stenosis and/or disc herniation, vascular injury, development of instability, neck/back pain, need for further surgery, persistent symptoms, development of deformity, and the risks of anesthesia. The patient understood these risks and agreed to proceed.  NAME OF ANTERIOR PROCEDURE:               1. Anterior lumbar interbody fusion via a right lateral retroperitoneal approach at L4/5 2. Placement of a Lordotic Globus Hedron interbody cage, filled with Demineralized Bone Matrix  NAME OF POSTERIOR PROCEDURE 1. Posterior instrumentation using Nuvasive Reline Instrumentation 2. Posterolateral fusion, L4/5, utilizing demineralized bone matrix 3. Use of Stereotaxis 4. Left L5/S1 microdiscectomy   PROCEDURE:  Patient was brought to the operating room, intubated, turned to the lateral position.  All pressure points were checked and double-checked.  The patient was prepped and draped in the standard fashion. Prior to prepping, fluoroscopy was brought in and the patient was positioned with a large bump under the contralateral side between the iliac crest and rib cage, allowing the area between the iliac crest and the lateral aspect of the rib cage to open and increase the ability to  reach inferiorly, to facilitate entry into the disc space.  The incision was marked upon the skin both the location of the disc space as well as the superior most aspect of the iliac crest.  Based on the identification of the disc space an incision was prepared, marked upon the skin and eventually was used for our lateral incision.  The fluoroscopy was turned into a cross table A/P image in order to confirm that the patient's spine remained in a perpendicular trajectory to the floor without rotation.  Once confirming that all the pressure points were checked and double-checked and the patient remained in sturdy position strapped down in this slightly jack-knifed lateral position, the patient was prepped and draped in standard fashion.  The skin was injected with local anesthetic, then incised until the abdominal wall fascia was noted.  I bluntly dissected posteriorly until we were able to identify the posterior musculature near petit's triangle.  At this point, using primarily blunt dissection with our finger aided with a metzenbaum scissor, were able to enter the retroperitoneal cavity.  The retroperitoneal potential space was opened further until palpating out the psoas muscle, the medial aspect of the iliac crest, the medial aspect of the last rib and continued to define the retroperitoneal space with blunt dissection in order to facilitate safe placement of our dilators.    While protecting by dissecting directly onto a finger in the retroperitoneum, the retroperitoneal space was entered safely from the lateral incision and the initial dilator placed onto the muscle belly of the psoas.  While directly stimulating the dilator and after radiographically confirming our location relative to the disc space, I placed the dilator through the psoas.  The dilators were stimulated to ensure remaining  safely away from any of the lumbar plexus nerves; the dilators were repositioned until no pathologic stimulation was  appreciated.  Once I had confirmed the location of our initial dilator radiographically, a K-wire secured the dilator into the L4/5 disc space and confirmed position under A/P and lateral fluoroscopy.  At this point, I dilated up with direct stimulation to confirm lack of pathologic stimulation.  Once all the dilators were in position, I placed in the retractor and secured it onto the table, locked into position and confirmed under A/P and lateral fluoroscopy to confirm our approach angle to the disc space as well as location relative to the disc space.  I then placed the muscle stimulator in through the working channel down to the vertebral body, stimulating the entire lateral surface of the vertebral body and any of the visualized psoas muscle that was adjacent to the retractor, confirming again the safe passage to the psoas before we began performing the discectomy.  At this point, we began our discectomy at L4/5.  The disc was incised laterally throughout the extent of our exposure. Using a combination of pituitary rongeurs, Kerrison rongeurs, rasps, curettes of various sorts, we were able to begin to clean out the disc space.  Once we had cleaned out the majority of the disc space, we then cut the lateral annulus with a cob, breaking the lateral annual attachments on the contralateral side by subtly working the cob through the annulus while using flouroscopy.  Care was taken not to extend further than required after cutting the annular attachments.  After this had been performed, we prepared the endplates for placement of our graft, sized a graft to the disc space by serially dilating up in trial sizes until we confirmed that our graft would be well positioned, allowing distraction while maintaining good grip.  This was confirmed under A/P and lateral fluoroscopy in order to ensure its placement as an eventual trial for placement of our final graft.  We irrigated with bacteriostatic saline.  Once confirmed  placement, the Hedron implant filled with allograft was impacted into position at L4/5.   Through a combination of intradiscal distraction and anterior releasing, we were able to correct the anterior deformity during disc preparation and placement of the graft.  At this point, final radiographs were performed, and we began closure.  The wound was closed using 0 Vicryl interrupted suture in the fascia and 2-0 Vicryl inverted suture were placed in the subcutaneous tissue and dermis. 3-0 monocryl was used for final closure. Dermabond was used to close the skin.    After closing the anterior part in layers, the patient was repositioned into prone position.  All pressure points were checked and double-checked.  The posterior operative site was prepped and draped in standard fashion.  The stereotactic array was placed.  Stereotactic images were acquired using intraoperative CT scanning.  This was registered to the patient.  Using stereotaxis, screw trajectories were planned and incisions made.  The pedicles from L4 to L5 were cannulated bilaterally and K wires used to secure the tracks.  We then utilized a stereotactic screwdriver to place pedicle screws from L4 to L5.  At each level, Nuvasive Reline pedicle screws were placed.  Once the screws were placed, the screw extensions were then linked, a path was formed for the rod and a rod was utilized to connect the screws.  We then compressed, torqued / counter-torqued and removed the screw assembly. Once performed on each side, the C-arm was  brought back and to take confirmatory CT scan showing appropriate placement of all instrumentation and anatomic alignment.    Posterolateral arthrodesis was performed at L4-L5 utilizing demineralized bone matrix.  At this point, we utilized the image guidance system to choose an incision for the microdiscectomy at L5/S1. A separate incision was made for this approach.   The Metrx tubes were sequentially advanced under  lateral fluoroscopy until a 18 x 40 mm Metrx tube was placed over the left L5/S1 facet and lamina and secured to the bed.    The microscope was then sterilely brought into the field and muscle creep was hemostased with a bipolar and resected with a pituitary rongeur.  A Bovie extender was then used to expose the spinous process and lamina.  Careful attention was placed to not violate the facet capsule. A 3 mm matchstick drill bit was then used to make a hemi-laminotomy trough until the ligamentum flavum was exposed.  This was extended to the base of the spinous process.  Once this was complete and the underlying ligamentum flavum was visualized, the ligamentum was dissected with an up angle curette and resected with a #2 and #3 mm biting Kerrison.  The laminotomy opening was also expanded in similar fashion and hemostasis was obtained with Surgifoam and a patty as well as bone wax.  The rostral aspect of the caudal level of the lamina was also resected with a #2 biting Kerrison effort to further enhance exposure.  Once the underlying dura was visualized a Penfield 4 was then used to dissect and expose the traversing nerve root.  Once this was identified a nerve root retractor suction was used to mobilize this medially.  The venous plexus was hemostased with Surgifoam and light bipolar use.  A small penfied was then used to make a small annulotomy within the disc space and disc space contents were noted to come through the annulus.    The disc herniation was identified and dissected free using a balltip probe. The pituitary rongeur was used to remove the extruded disc fragments. Once the thecal sac and nerve root were noted to be relaxed and under less tension the ball-tipped feeler was passed along the foramen distally to ensure no residual compression was noted.    The area was irrigated. The tube system was then removed under microscopic visualization and hemostasis was obtained with a bipolar.  A drain was  placed.   Each wound was closed using 0 Vicryl interrupted suture in the fascia, 2-0 Vicryl inverted suture were placed in the subcutaneous tissue and dermis. 3-0 monocryl was used for final closure. Dermabond was used to close the skin.    Needle, lap and all counts were correct at the end of the case.    There was no pathologic change in the neuromonitoring during the procedure.   Manning Charity PA assisted in the entire procedure. An assistant was required for this procedure due to the complexity.  The assistant provided assistance in tissue manipulation and suction, and was required for the successful and safe performance of the procedure. I performed the critical portions of the procedure.   Venetia Night MD Neurosurgery

## 2022-08-17 NOTE — Discharge Instructions (Addendum)
  Your surgeon has performed an operation on your lumbar spine (low back) to relieve pressure on one or more nerves. Many times, patients feel better immediately after surgery and can "overdo it." Even if you feel well, it is important that you follow these activity guidelines. If you do not let your back heal properly from the surgery, you can increase the chance of hardware complications and/or return of your symptoms. The following are instructions to help in your recovery once you have been discharged from the hospital.  Do not use NSAIDs for 3 months after surgery.   Activity    No bending, lifting, or twisting ("BLT"). Avoid lifting objects heavier than 10 pounds (gallon milk jug).  Where possible, avoid household activities that involve lifting, bending, pushing, or pulling such as laundry, vacuuming, grocery shopping, and childcare. Try to arrange for help from friends and family for these activities while your back heals.  Increase physical activity slowly as tolerated.  Taking short walks is encouraged, but avoid strenuous exercise. Do not jog, run, bicycle, lift weights, or participate in any other exercises unless specifically allowed by your doctor. Avoid prolonged sitting, including car rides.  Talk to your doctor before resuming sexual activity.  You should not drive until cleared by your doctor.  Until released by your doctor, you should not return to work or school.  You should rest at home and let your body heal.   You may shower three days after your surgery.  After showering, lightly dab your incision dry. Do not take a tub bath or go swimming for 3 weeks, or until approved by your doctor at your follow-up appointment.  If you smoke, we strongly recommend that you quit.  Smoking has been proven to interfere with normal healing in your back and will dramatically reduce the success rate of your surgery. Please contact [facility] (800-QUIT-NOW) and use the resources at  www.[facility].com for assistance in stopping smoking.  Surgical Incision   If you have a dressing on your incision, you may remove it three days after your surgery. Keep your incision area clean and dry.  Your incision was closed with Dermabond glue. The glue should begin to peel away within about a week.  Diet            You may return to your usual diet. Be sure to stay hydrated.  When to Contact Us  Although your surgery and recovery will likely be uneventful, you may have some residual numbness, aches, and pains in your back and/or legs. This is normal and should improve in the next few weeks.  However, should you experience any of the following, contact us immediately: New numbness or weakness Pain that is progressively getting worse, and is not relieved by your pain medications or rest Bleeding, redness, swelling, pain, or drainage from surgical incision Chills or flu-like symptoms Fever greater than 101.0 F (38.3 C) Problems with bowel or bladder functions Difficulty breathing or shortness of breath Warmth, tenderness, or swelling in your calf  Contact Information During office hours (Monday-Friday 9 am to 5 pm), please call your physician at [phone] and ask for Kendelyn Jean After hours and weekends, please call [phone] and speak with the neurosurgeon on call For a life-threatening emergency, call 911 

## 2022-08-17 NOTE — Anesthesia Preprocedure Evaluation (Signed)
Anesthesia Evaluation  Patient identified by MRN, date of birth, ID band Patient awake    Reviewed: Allergy & Precautions, NPO status , Patient's Chart, lab work & pertinent test results  History of Anesthesia Complications Negative for: history of anesthetic complications  Airway Mallampati: III  TM Distance: >3 FB Neck ROM: Full    Dental no notable dental hx. (+) Teeth Intact, Dental Advidsory Given   Pulmonary neg sleep apnea, neg COPD, Patient abstained from smoking., former smoker   Pulmonary exam normal breath sounds clear to auscultation       Cardiovascular Exercise Tolerance: Good hypertension, (-) CAD and (-) Past MI (-) dysrhythmias  Rhythm:Regular Rate:Normal - Systolic murmurs    Neuro/Psych negative neurological ROS  negative psych ROS   GI/Hepatic ,neg GERD  ,,(+)     (-) substance abuse    Endo/Other  diabetes, Type 2    Renal/GU      Musculoskeletal   Abdominal   Peds  Hematology   Anesthesia Other Findings Patient has a history of alcoholism. States he hasn't drank since yesterday. Doesn't feel any symptoms today but stated he will usually feel something if he goes more than a day without drinking. Discussed preop midazolam administration. Patient is in agreement.   Past Medical History: No date: Diabetes mellitus without complication (HCC) No date: History of kidney stones     Comment:  2006- lithotripsy No date: Hypertension  Reproductive/Obstetrics                             Anesthesia Physical Anesthesia Plan  ASA: 2  Anesthesia Plan: General ETT   Post-op Pain Management:    Induction: Intravenous  PONV Risk Score and Plan: 1 and Ondansetron, Dexamethasone and Midazolam  Airway Management Planned: Oral ETT  Additional Equipment: None  Intra-op Plan:   Post-operative Plan: Extubation in OR  Informed Consent: I have reviewed the patients History  and Physical, chart, labs and discussed the procedure including the risks, benefits and alternatives for the proposed anesthesia with the patient or authorized representative who has indicated his/her understanding and acceptance.     Dental Advisory Given  Plan Discussed with: Anesthesiologist, CRNA and Surgeon  Anesthesia Plan Comments: (Patient consented for risks of anesthesia including but not limited to:  - adverse reactions to medications - damage to eyes, teeth, lips or other oral mucosa - nerve damage due to positioning  - sore throat or hoarseness - Damage to heart, brain, nerves, lungs, other parts of body or loss of life  Patient voiced understanding.)       Anesthesia Quick Evaluation

## 2022-08-17 NOTE — Anesthesia Procedure Notes (Signed)
Procedure Name: Intubation Date/Time: 08/17/2022 11:19 AM  Performed by: Hezzie Bump, CRNAPre-anesthesia Checklist: Patient identified, Patient being monitored, Timeout performed, Emergency Drugs available and Suction available Patient Re-evaluated:Patient Re-evaluated prior to induction Oxygen Delivery Method: Circle system utilized Preoxygenation: Pre-oxygenation with 100% oxygen Induction Type: IV induction Ventilation: Mask ventilation without difficulty Laryngoscope Size: McGraph and 4 Grade View: Grade I Tube type: Oral Tube size: 7.0 mm Number of attempts: 1 Airway Equipment and Method: Stylet Placement Confirmation: ETT inserted through vocal cords under direct vision, positive ETCO2 and breath sounds checked- equal and bilateral Secured at: 21 cm Tube secured with: Tape Dental Injury: Teeth and Oropharynx as per pre-operative assessment

## 2022-08-18 ENCOUNTER — Encounter: Payer: Self-pay | Admitting: Neurosurgery

## 2022-08-18 LAB — CREATININE, SERUM
Creatinine, Ser: 1.2 mg/dL (ref 0.61–1.24)
GFR, Estimated: >60 mL/min (ref >60)

## 2022-08-18 MED ORDER — METHOCARBAMOL 500 MG PO TABS: 500.0000 mg | ORAL_TABLET | Freq: Four times a day (QID) | ORAL | 0 refills | Status: DC | PRN

## 2022-08-18 MED ORDER — HYDROMORPHONE HCL 2 MG PO TABS: 2.0000 mg | ORAL_TABLET | ORAL | 0 refills | Status: AC | PRN

## 2022-08-18 MED ORDER — CELECOXIB 200 MG PO CAPS: 200.0000 mg | ORAL_CAPSULE | Freq: Two times a day (BID) | ORAL | 2 refills | Status: DC

## 2022-08-18 NOTE — Evaluation (Addendum)
Physical Therapy Evaluation Patient Details Name: Johnathan Potts MRN: [id number] DOB: [birth year] Today's Date: 08/18/2022  History of Present Illness  Pt is a 52 y.o. male with PMH consisting of HTN, DM type 2, alcoholism, lumbar radiculopathy. Pt is now s/p anterior L4/5 lumbar fusion (7/17).  Clinical Impression  Pt pleasant and motivated for therapy. Pt educated at start of session on log-rolling technique for bed mobility to help with pain control; he was able to perform with supervision assist for VC's however needed no physical assistance to sit EOB. Pt able to perform STS from bed with mod I and RW. He ambulated ~50 with RW but then was able to ambulate the rest of the distance using no AD. He had no instances of LOB throughout ambulation and demonstrated a good, consistent cadence. Pt able to navigate 4 stairs x 2 using no rails or AD with a reciprocal gait pattern; he was educated on sequencing stairs for pain control if necessary. Vitals remained WNL throughout the entire session, with HR mid 60's to low 70's and O2 high 90's on room air. Pt will benefit from continued PT services upon discharge to safely address deficits listed in patient problem list for decreased caregiver assistance and eventual return to PLOF.       Assistance Recommended at Discharge PRN  If plan is discharge home, recommend the following:  Can travel by private vehicle  A little help with walking and/or transfers;A little help with bathing/dressing/bathroom;Assistance with cooking/housework;Assist for transportation;Help with stairs or ramp for entrance        Equipment Recommendations None recommended by PT  Recommendations for Other Services       Functional Status Assessment Patient has had a recent decline in their functional status and demonstrates the ability to make significant improvements in function in a reasonable and predictable amount of time.     Precautions / Restrictions  Precautions Precautions: Back Restrictions Weight Bearing Restrictions: No      Mobility  Bed Mobility Overal bed mobility: Needs Assistance Bed Mobility: Rolling, Sidelying to Sit Rolling: Supervision Sidelying to sit: Supervision       General bed mobility comments: Supervision assist for VC's to perform log-roll to sit EOB, no physical assistance needed.    Transfers Overall transfer level: Modified independent Equipment used: Rolling walker (2 wheels)               General transfer comment: Mod I to perform STS from bed.    Ambulation/Gait Ambulation/Gait assistance: Min guard Gait Distance (Feet): 1 x 50 Feet with RW, 1 x 250 feet with no AD Assistive device: Rolling walker (2 wheels), None Gait Pattern/deviations: WFL(Within Functional Limits), Step-through pattern Gait velocity: WNL     General Gait Details: Pt ambulated ~50 feet with RW, then the rest of the distance with no AD. Self-selected reciprocal gait pattern, gait speed WNL. No instances of LOB or legs buckling. Min guard throughout for safety but pt likely able to perform independently.  Stairs Stairs: Yes Stairs assistance: Min guard Stair Management: No rails, Alternating pattern, Forwards Number of Stairs: 4 stairs x 2 General stair comments: Pt able to navigate 4 stairs x 2 with no use of rails or AD, reciprocal pattern, no LOB. Min guard for safety.  Wheelchair Mobility     Tilt Bed    Modified Rankin (Stroke Patients Only)       Balance Overall balance assessment: Independent  Pertinent Vitals/Pain Pain Assessment Pain Assessment: 0-10 Pain Score: 5  Pain Location: Low back Pain Descriptors / Indicators: Sore Pain Intervention(s): Limited activity within patient's tolerance, Monitored during session, Repositioned    Home Living Family/patient expects to be discharged to:: Private residence Living Arrangements:  Spouse/significant other Available Help at Discharge: Family;Available 24 hours/day (Wife (works from home)) Type of Home: House Home Access: Level entry       Home Layout: One level (2 steps inside house to get to different rooms) Home Equipment: Gilmer Mor - single point      Prior Function Prior Level of Function : Independent/Modified Independent;Working/employed;Driving             Mobility Comments: Ind. 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 ADLs Comments: Independent with all ADL's, IADL's.     Hand Dominance        Extremity/Trunk Assessment   Upper Extremity Assessment Upper Extremity Assessment: Overall WFL for tasks assessed (MMT screen grossly 5/5 bilaterally)    Lower Extremity Assessment Lower Extremity Assessment: Overall WFL for tasks assessed (MMT screen grossly 5/5 bilaterally)       Communication   Communication: No difficulties  Cognition Arousal/Alertness: Awake/alert Behavior During Therapy: WFL for tasks assessed/performed Overall Cognitive Status: Within Functional Limits for tasks assessed                                          General Comments      Exercises Other Exercises Other Exercises: Education on log-rolling technique, back precautions, stair navigation for pain control.   Assessment/Plan    PT Assessment Patient needs continued PT services  PT Problem List Pain;Decreased activity tolerance;Decreased mobility       PT Treatment Interventions Therapeutic exercise;Gait training;Balance training;Stair training;Functional mobility training;Therapeutic activities;Patient/family education    PT Goals (Current goals can be found in the Care Plan section)  Acute Rehab PT Goals Patient Stated Goal: "to be operational and pain free" PT Goal Formulation: With patient Time For Goal Achievement: 08/31/22 Potential to Achieve Goals: Good    Frequency Min 1X/week      Co-evaluation               AM-PAC PT "6 Clicks" Mobility  Outcome Measure Help needed turning from your back to your side while in a flat bed without using bedrails?: None Help needed moving from lying on your back to sitting on the side of a flat bed without using bedrails?: A Little Help needed moving to and from a bed to a chair (including a wheelchair)?: None Help needed standing up from a chair using your arms (e.g., wheelchair or bedside chair)?: None Help needed to walk in hospital room?: None Help needed climbing 3-5 steps with a railing? : None 6 Click Score: 23    End of Session Equipment Utilized During Treatment: Gait belt Activity Tolerance: Patient tolerated treatment well Patient left: in chair;with family/visitor present;with call bell/phone within reach;with SCD's reapplied;with chair alarm set Nurse Communication: Mobility status PT Visit Diagnosis: Other abnormalities of gait and mobility (R26.89);Pain    Time: 7253-6644 PT Time Calculation (min) (ACUTE ONLY): 24 min   Charges:                 Cena Benton, SPT 08/18/22, 10:07 AM This entire session was performed under direct supervision and direction of a licensed therapist/therapist assistant. I have  personally read, edited and approve of the note as written.  Loran Senters, DPT

## 2022-08-18 NOTE — Discharge Summary (Signed)
Physician Discharge Summary  Patient ID: Johnathan Potts MRN: [id number] DOB/AGE: 1970-07-15 52 y.o.  Admit date: 08/17/2022 Discharge date: 08/18/2022  Admission Diagnoses: Chronic bilateral low back pain with bilateral sciatica   Lumbar radiculopathy  Discharge Diagnoses:  Principal Problem:   S/P lumbar fusion Active Problems:   Chronic bilateral low back pain with bilateral sciatica   Lumbar radiculopathy   Discharged Condition: good  Hospital Course: Johnathan Potts was admitted for elective spinal intervention.  He did well with surgery and was discharged on POD1.  Consults: None  Significant Diagnostic Studies: radiology: X-Ray: good placement of implants  Treatments: surgery: L4-5 XLIF PSF with L L5/S1 microdiscectomy  Discharge Exam: Blood pressure (!) 157/68, pulse 65, temperature 98 F (36.7 C), resp. rate 20, height 5\' 5"  (1.651 m), weight 70.3 kg, SpO2 100%. General appearance: alert and cooperative CNI MAEW  Disposition: Discharge disposition: 01-Home or Self Care       Discharge Instructions     Discharge patient   Complete by: As directed    Discharge disposition: 01-Home or Self Care   Discharge patient date: 08/18/2022   Incentive spirometry RT   Complete by: As directed       Allergies as of 08/18/2022   No Known Allergies      Medication List     TAKE these medications    amLODipine 10 MG tablet Commonly known as: NORVASC Take 20 mg by mouth daily.   aspirin EC 81 MG tablet Take 81 mg by mouth daily. Swallow whole.   betamethasone dipropionate 0.05 % cream Apply 1 application  topically as needed.   buPROPion 150 MG 24 hr tablet Commonly known as: WELLBUTRIN XL Take 150 mg by mouth daily.   buPROPion 150 MG 12 hr tablet Commonly known as: WELLBUTRIN SR 1 tab daily; may increase to 1 tab BID after a week if no side effects   celecoxib 200 MG capsule Commonly known as: CeleBREX Take 1 capsule (200 mg total) by mouth 2 (two)  times daily.   cyanocobalamin 500 MCG tablet Commonly known as: VITAMIN B12 Take 500 mcg by mouth daily.   Dexcom G6 Receiver Devi as directed.   Dexcom G6 Sensor Misc as directed.   Dexcom G6 Transmitter Misc as directed.   fenofibrate 160 MG tablet Take 160 mg by mouth daily.   gabapentin 300 MG capsule Commonly known as: NEURONTIN Take 300 mg by mouth at bedtime.   hydrochlorothiazide 25 MG tablet Commonly known as: HYDRODIURIL Take 25 mg by mouth daily.   HYDROmorphone 2 MG tablet Commonly known as: DILAUDID Take 1 tablet (2 mg total) by mouth every 4 (four) hours as needed for up to 7 days for severe pain.   lisinopril 20 MG tablet Commonly known as: ZESTRIL Take 20 mg by mouth 2 (two) times daily.   magnesium oxide 400 MG tablet Commonly known as: MAG-OX Take 400 mg by mouth 2 (two) times daily.   metFORMIN 750 MG 24 hr tablet Commonly known as: GLUCOPHAGE-XR Take 750 mg by mouth 2 (two) times daily.   methocarbamol 500 MG tablet Commonly known as: ROBAXIN Take 1 tablet (500 mg total) by mouth every 6 (six) hours as needed for muscle spasms.   metoprolol succinate 50 MG 24 hr tablet Commonly known as: TOPROL-XL Take 2 tablets by mouth daily.   omeprazole 20 MG tablet Commonly known as: PRILOSEC OTC Take 20 mg by mouth every other day.   Ozempic (0.25 or 0.5 MG/DOSE) 2 MG/1.5ML Sopn  Generic drug: Semaglutide(0.25 or 0.5MG /DOS) Inject 0.5 mg into the skin once a week.   Ozempic (1 MG/DOSE) 4 MG/3ML Sopn Generic drug: Semaglutide (1 MG/DOSE) INJECT 1MG  SUBCUTANEOUSLY ONCE A WEEK   simvastatin 20 MG tablet Commonly known as: ZOCOR Take 20 mg by mouth at bedtime.        Follow-up Information     Drake Leach, PA-C Follow up on 08/31/2022.   Specialty: Neurosurgery Why: 0830 Contact information: [address] [place] [address] [phone]                 Signed: Venetia Night 08/18/2022, 12:39  PM

## 2022-08-18 NOTE — TOC Progression Note (Signed)
Transition of Care [facility]) - Progression Note    Patient Details  Name: Johnathan Potts MRN: [id number] Date of Birth: 06-06-1970  Transition of Care [facility]) CM/SW Contact  Marlowe Sax, RN Phone Number: 08/18/2022, 8:42 AM  Clinical Narrative:     Transition of Care [facility]) - Inpatient Brief Assessment   Patient Details  Name: Johnathan Potts MRN: [id number] Date of Birth: [birth year]  Transition of Care [facility]) CM/SW Contact:    Marlowe Sax, RN Phone Number: 08/18/2022, 8:42 AM   Clinical Narrative:  NO identified TOC needs   Transition of Care (TOC) Screening Note   Patient Details  Name: Johnathan Potts Date of Birth: 10-11-1970   Transition of Care [facility]) CM/SW Contact:    Marlowe Sax, RN Phone Number: 08/18/2022, 8:44 AM    Transition of Care Department [facility]) has reviewed patient and no TOC needs have been identified at this time. We will continue to monitor patient advancement through interdisciplinary progression rounds. If new patient transition needs arise, please place a TOC consult.    Transition of Care Asessment: Insurance and Status: Insurance coverage has been reviewed Patient has primary care physician: Yes Terance Hart, Onalee Hua, MD) Home environment has been reviewed: Yes, Home with SPouse Prior level of function:: Independent Prior/Current Home Services: No current home services Social Determinants of Health Reivew: SDOH reviewed no interventions necessary Readmission risk has been reviewed: Yes Transition of care needs: no transition of care needs at this time        Expected Discharge Plan and Services                                               Social Determinants of Health (SDOH) Interventions SDOH Screenings   Food Insecurity: No Food Insecurity (08/17/2022)  Housing: Low Risk  (08/17/2022)  Transportation Needs: No Transportation Needs (08/17/2022)  Utilities: Not At Risk (08/17/2022)  Financial Resource Strain: Low  Risk  (12/01/2021)   [facility], [facility]  Social Connections: Unknown (06/15/2021)   Received from [facility]  Tobacco Use: Medium Risk (08/17/2022)    Readmission Risk Interventions     No data to display

## 2022-08-18 NOTE — Plan of Care (Signed)
  Problem: Education: Goal: Ability to verbalize activity precautions or restrictions will improve Outcome: Progressing   Problem: Pain Management: Goal: Pain level will decrease Outcome: Progressing   Problem: Skin Integrity: Goal: Will show signs of wound healing Outcome: Progressing   Problem: Pain Managment: Goal: General experience of comfort will improve Outcome: Progressing   Problem: Safety: Goal: Ability to remain free from injury will improve Outcome: Progressing   Problem: Skin Integrity: Goal: Risk for impaired skin integrity will decrease Outcome: Progressing   

## 2022-08-18 NOTE — Progress Notes (Signed)
   Neurosurgery Progress Note  History: Johnathan Potts is here for chronic and worsening back pain as well as lumbar radiculopathy.  POD1: Doing well.  Has not walked.  Physical Exam: Vitals:   08/17/22 2345 08/18/22 0532  BP: 124/76 (!) 157/68  Pulse: 86 65  Resp: 20 20  Temp: 98 F (36.7 C) 98 F (36.7 C)  SpO2: 99% 100%    AA Ox3 CNI  Strength:5/5 throughout bilateral lower extremities  Data:  Other tests/results: Drain output 50  Assessment/Plan:  Johnathan Potts is doing well after lumbar intervention.  - mobilize - pain control - DVT prophylaxis - PTOT today -Will monitor drain output and likely remove later today   Venetia Night MD, [facility] Department of Neurosurgery

## 2022-08-18 NOTE — Evaluation (Signed)
Occupational Therapy Evaluation Patient Details Name: Johnathan Potts MRN: [id number] DOB: 09-24-1970 Today's Date: 08/18/2022   History of Present Illness Pt is a 52 y.o. male with PMH consisting of HTN, DM type 2, alcoholism, lumbar radiculopathy. Pt is now s/p anterior L4/5 lumbar fusion (7/17).   Clinical Impression   Johnathan Potts was seen for OT evaluation this date. Prior to hospital admission, pt was IND. Pt lives with spouse who works from home. Pt currently requires MAX A don/doff compression socks in sitting - plan for assist from spouse. Pt reports many pets in home - educated on falls prevention strategies. Pt educated in functional application of back precautions, AE for bathing/dressing/ toileting. Pt verbalized understanding of all education/training provided. Handout provided to support recall and carry over of learned precautions/techniques. All education complete, will sign off. Upon hospital discharge, recommend no OT follow up.    Recommendations for follow up therapy are one component of a multi-disciplinary discharge planning process, led by the attending physician.  Recommendations may be updated based on patient status, additional functional criteria and insurance authorization.   Assistance Recommended at Discharge Set up Supervision/Assistance  Patient can return home with the following A little help with bathing/dressing/bathroom;Help with stairs or ramp for entrance    Functional Status Assessment  Patient has had a recent decline in their functional status and demonstrates the ability to make significant improvements in function in a reasonable and predictable amount of time.  Equipment Recommendations  None recommended by OT    Recommendations for Other Services       Precautions / Restrictions Precautions Precautions: Back Precaution Booklet Issued: Yes (comment) Restrictions Weight Bearing Restrictions: No      Mobility Bed Mobility Overal bed mobility:  Needs Assistance Bed Mobility: Rolling, Sidelying to Sit, Sit to Sidelying Rolling: Supervision Sidelying to sit: Supervision     Sit to sidelying: Supervision      Transfers Overall transfer level: Modified independent Equipment used: None                      Balance Overall balance assessment: Independent                                         ADL either performed or assessed with clinical judgement   ADL Overall ADL's : Needs assistance/impaired                                       General ADL Comments: MAX A don/doff compression socks in sitting - plan for assist from spouse.      Pertinent Vitals/Pain Pain Assessment Pain Assessment: 0-10 Pain Score: 5  Pain Location: Low back Pain Descriptors / Indicators: Sore Pain Intervention(s): Limited activity within patient's tolerance, Premedicated before session     Hand Dominance     Extremity/Trunk Assessment Upper Extremity Assessment Upper Extremity Assessment: Overall WFL for tasks assessed   Lower Extremity Assessment Lower Extremity Assessment: Overall WFL for tasks assessed       Communication Communication Communication: No difficulties   Cognition Arousal/Alertness: Awake/alert Behavior During Therapy: WFL for tasks assessed/performed Overall Cognitive Status: Within Functional Limits for tasks assessed  Home Living Family/patient expects to be discharged to:: Private residence Living Arrangements: Spouse/significant other Available Help at Discharge: Family;Available 24 hours/day (wife works from home) Type of Home: House Home Access: Level entry     Home Layout: One level (2 steps inside house to get to different rooms)     Bathroom Shower/Tub: Arts development officer Toilet: Handicapped height Bathroom Accessibility: Yes   Home Equipment: Cane - single point           Prior Functioning/Environment Prior Level of Function : Independent/Modified Independent;Working/employed;Driving             Mobility Comments: Ind. 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 ADLs Comments: Independent with all ADL's, IADL's.        OT Problem List: Decreased range of motion         OT Goals(Current goals can be found in the care plan section) Acute Rehab OT Goals Patient Stated Goal: to go home OT Goal Formulation: With patient/family Time For Goal Achievement: 09/01/22 Potential to Achieve Goals: Good   AM-PAC OT "6 Clicks" Daily Activity     Outcome Measure Help from another person eating meals?: None Help from another person taking care of personal grooming?: None Help from another person toileting, which includes using toliet, bedpan, or urinal?: A Little Help from another person bathing (including washing, rinsing, drying)?: A Little Help from another person to put on and taking off regular upper body clothing?: None Help from another person to put on and taking off regular lower body clothing?: A Little 6 Click Score: 21   End of Session    Activity Tolerance: Patient tolerated treatment well Patient left: in bed;with call bell/phone within reach;with family/visitor present  OT Visit Diagnosis: Unsteadiness on feet (R26.81)                Time: 1610-9604 OT Time Calculation (min): 11 min Charges:  OT General Charges $OT Visit: 1 Visit OT Evaluation $OT Eval Low Complexity: 1 Low  Kathie Dike, M.S. OTR/L  08/18/22, 10:41 AM  ascom [phone]

## 2022-08-19 NOTE — Anesthesia Postprocedure Evaluation (Signed)
Anesthesia Post Note  Patient: Johnathan Potts  Procedure(s) Performed: L4-5 LATERAL LUMBAR INTERBODY FUSION WITH POSTERIOR SPINAL FUSION (Spine Lumbar) LEFT L5-S1 MICRODISCECTOMY (Left: Spine Lumbar) APPLICATION OF INTRAOPERATIVE CT SCAN  Patient location during evaluation: PACU Anesthesia Type: General Level of consciousness: awake and alert Pain management: pain level controlled Vital Signs Assessment: post-procedure vital signs reviewed and stable Respiratory status: spontaneous breathing, nonlabored ventilation, respiratory function stable and patient connected to nasal cannula oxygen Cardiovascular status: blood pressure returned to baseline and stable Postop Assessment: no apparent nausea or vomiting Anesthetic complications: no   No notable events documented.   Last Vitals:  Vitals:   08/17/22 2345 08/18/22 0532  BP: 124/76 (!) 157/68  Pulse: 86 65  Resp: 20 20  Temp: 36.7 C 36.7 C  SpO2: 99% 100%    Last Pain:  Vitals:   08/18/22 0954  TempSrc:   PainSc: 3                  Stephanie Coup

## 2022-08-26 NOTE — Progress Notes (Unsigned)
   REFERRING PHYSICIAN:  Dorothey Baseman, Md [address] [place],  [place] [address]  DOS: 08/17/22  L4-5 XLIF PSF with Left L5/S1 microdiscectomy   HISTORY OF PRESENT ILLNESS: Johnathan Potts is approximately 2 weeks status post L4-5 XLIF PSF with Left L5/S1 microdiscectomy . Was given celebrex, dilaudid, and robaxin on discharge from the hospital.   He has soreness in his lower back as expected. He also is having intermittent right leg pain into lateral thigh to his calf. This is worse with lifting his leg.   He ran out of dilaudid last Friday. He is taking celebrex and robaxin. He is taking neurontin at night. Needs a refill.    PHYSICAL EXAMINATION:  General: Patient is well developed, well nourished, calm, collected, and in no apparent distress.   NEUROLOGICAL:  General: In no acute distress.   Awake, alert, oriented to person, place, and time.  Pupils equal round and reactive to light.  Facial tone is symmetric.     Strength:            Side Iliopsoas Quads Hamstring PF DF EHL  R 5 5 5 5 5 5   L 5 5 5 5 5 5    Incisions are c/d/I  He has some pain with testing iliopsoas on right, but no weakness.   ROS (Neurologic):  Negative except as noted above  IMAGING: Nothing new to review.   ASSESSMENT/PLAN:  Johnathan Potts is doing well s/p above surgery. Treatment options reviewed with patient and following plan made:   - I have advised the patient to lift up to 10 pounds until 6 weeks after surgery (follow up with Dr. Myer Haff).  - Reviewed wound care.  - No bending, twisting, or lifting.  - He requests to start PT for lumbar spine. Orders to [facility] in [facility].  - Continue on current medications including prn dilaudid, robaxin, and celebrex. - Refill on diluadid, directions changed to q 6 hours prn. PMP reviewed and is appropriate.  - Refill given on neurontin as well.  - Discussed that right leg pain should improve over time.  - Follow up as scheduled in 4 weeks and prn. Will  need xrays at that visit.   BP was elevated. No symptoms of chest pain, shortness of breath, blurry vision, or headaches. He is checking at home and working closely with PCP. He will follow up with PCP. If he develops CP, SOB, blurry vision, or headaches, then he will go to ED.     Advised to contact the office if any questions or concerns arise.  Drake Leach PA-C Department of neurosurgery

## 2022-08-31 ENCOUNTER — Encounter: Payer: Self-pay | Admitting: Orthopedic Surgery

## 2022-08-31 ENCOUNTER — Ambulatory Visit (INDEPENDENT_AMBULATORY_CARE_PROVIDER_SITE_OTHER): Payer: BC Managed Care – PPO | Admitting: Orthopedic Surgery

## 2022-08-31 VITALS — BP 162/90 | Temp 98.2°F | Ht 65.0 in | Wt 163.0 lb

## 2022-08-31 DIAGNOSIS — G8929 Other chronic pain: Secondary | ICD-10-CM

## 2022-08-31 DIAGNOSIS — Z981 Arthrodesis status: Secondary | ICD-10-CM

## 2022-08-31 DIAGNOSIS — M5416 Radiculopathy, lumbar region: Secondary | ICD-10-CM

## 2022-08-31 DIAGNOSIS — Z09 Encounter for follow-up examination after completed treatment for conditions other than malignant neoplasm: Secondary | ICD-10-CM

## 2022-08-31 MED ORDER — GABAPENTIN 300 MG PO CAPS
300.0000 mg | ORAL_CAPSULE | Freq: Every day | ORAL | 1 refills | Status: DC
Start: 2022-08-31 — End: 2022-11-07

## 2022-08-31 MED ORDER — HYDROMORPHONE HCL 2 MG PO TABS
2.0000 mg | ORAL_TABLET | Freq: Four times a day (QID) | ORAL | 0 refills | Status: DC | PRN
Start: 2022-08-31 — End: 2022-09-06

## 2022-09-06 ENCOUNTER — Other Ambulatory Visit: Payer: Self-pay | Admitting: Neurosurgery

## 2022-09-06 DIAGNOSIS — Z981 Arthrodesis status: Secondary | ICD-10-CM

## 2022-09-06 DIAGNOSIS — M5416 Radiculopathy, lumbar region: Secondary | ICD-10-CM

## 2022-09-06 MED ORDER — METHOCARBAMOL 500 MG PO TABS: 500.0000 mg | ORAL_TABLET | Freq: Four times a day (QID) | ORAL | 0 refills | Status: DC | PRN

## 2022-09-06 MED ORDER — HYDROMORPHONE HCL 2 MG PO TABS
2.0000 mg | ORAL_TABLET | Freq: Three times a day (TID) | ORAL | 0 refills | Status: AC | PRN
Start: 2022-09-06 — End: 2022-09-13

## 2022-09-06 NOTE — Progress Notes (Signed)
PDMP reviewed and appropriate.  Refill Dilaudid at a frequency of every 8 hours as needed.

## 2022-09-16 ENCOUNTER — Encounter: Payer: Self-pay | Admitting: Neurosurgery

## 2022-09-16 NOTE — Telephone Encounter (Signed)
I spoke with Johnathan Potts. He denies fever, chills, or drainage from the incision. He is going to send a picture.

## 2022-09-16 NOTE — Telephone Encounter (Signed)
DOS: 08/17/22  L4-5 XLIF PSF with Left L5/S1 microdiscectomy   No drainage, fevers, or chills.   Picture looks okay. He can clean daily and cover with dry dressing. He will send another picture on Monday.   Depending on how things look Monday, may have him come to pick up medihoney.

## 2022-09-23 ENCOUNTER — Other Ambulatory Visit: Payer: Self-pay

## 2022-09-23 DIAGNOSIS — M5416 Radiculopathy, lumbar region: Secondary | ICD-10-CM

## 2022-09-27 ENCOUNTER — Ambulatory Visit
Admission: RE | Admit: 2022-09-27 | Discharge: 2022-09-27 | Disposition: A | Discharge status: Home / Self Care | Payer: BC Managed Care – PPO | Attending: Neurosurgery | Admitting: Neurosurgery

## 2022-09-27 ENCOUNTER — Ambulatory Visit: Admission: RE | Admit: 2022-09-27 | Payer: BC Managed Care – PPO | Source: Ambulatory Visit

## 2022-09-27 ENCOUNTER — Encounter: Payer: Self-pay | Admitting: Neurosurgery

## 2022-09-27 ENCOUNTER — Ambulatory Visit: Payer: BC Managed Care – PPO | Admitting: Neurosurgery

## 2022-09-27 VITALS — BP 162/92 | Temp 98.2°F | Ht 65.0 in | Wt 166.0 lb

## 2022-09-27 DIAGNOSIS — M5416 Radiculopathy, lumbar region: Secondary | ICD-10-CM

## 2022-09-27 DIAGNOSIS — Z09 Encounter for follow-up examination after completed treatment for conditions other than malignant neoplasm: Secondary | ICD-10-CM

## 2022-09-27 NOTE — Progress Notes (Signed)
   REFERRING PHYSICIAN:  Dorothey Baseman, Md [address] [place],  [place] [address]  DOS: 08/17/22  L4-5 XLIF PSF with Left L5/S1 microdiscectomy   HISTORY OF PRESENT ILLNESS: Johnathan Potts is status post L4-5 XLIF PSF with Left L5/S1 microdiscectomy .  He is doing reasonably well.  He began having some pain around his left buttock approximately 2 weeks ago.  Prior to that he was doing quite well.   PHYSICAL EXAMINATION:  General: Patient is well developed, well nourished, calm, collected, and in no apparent distress.   NEUROLOGICAL:  General: In no acute distress.   Awake, alert, oriented to person, place, and time.  Pupils equal round and reactive to light.  Facial tone is symmetric.     Strength:            Side Iliopsoas Quads Hamstring PF DF EHL  R 5 5 5 5 5 5   L 5 5 5 5 5 5    Incisions are c/d/I  He has some pain with testing iliopsoas on right, but no weakness.   ROS (Neurologic):  Negative except as noted above  IMAGING: No complications noted   ASSESSMENT/PLAN:  Johnathan Potts is doing well s/p above surgery.  He has what sounds like musculoskeletal pain around his left SI joint.  I suspect this will improve with time.  He will continue physical therapy.  We reviewed his lifting restrictions.  He is not ready to return to work.  Will see him back in approximately 6 weeks.  Hopefully at that point his left SI area pain will be improved.    Venetia Night MD Department of neurosurgery

## 2022-09-28 ENCOUNTER — Other Ambulatory Visit: Payer: Self-pay | Admitting: Neurosurgery

## 2022-09-29 NOTE — Telephone Encounter (Signed)
Refill of robaxin okay. Sent to pharmacy and patient aware.

## 2022-11-04 ENCOUNTER — Other Ambulatory Visit: Payer: Self-pay | Admitting: Orthopedic Surgery

## 2022-11-04 DIAGNOSIS — Z981 Arthrodesis status: Secondary | ICD-10-CM

## 2022-11-04 DIAGNOSIS — M5416 Radiculopathy, lumbar region: Secondary | ICD-10-CM

## 2022-11-04 NOTE — Progress Notes (Unsigned)
   REFERRING PHYSICIAN:  Dorothey Baseman, Md [address] [place],  [place] [address]  DOS: 08/17/22  L4-5 XLIF PSF with Left L5/S1 microdiscectomy   HISTORY OF PRESENT ILLNESS: Johnathan Potts was having some left buttock pain at his last visit. Dr. Myer Haff felt this may be from his SI joint and advised him to continue with PT.   He is doing well with no pain. He has some intermittent soreness. Overall, he feels good!  He continues on celebrex, neurontin, and prn robaxin.   PHYSICAL EXAMINATION:  General: Patient is well developed, well nourished, calm, collected, and in no apparent distress.   NEUROLOGICAL:  General: In no acute distress.   Awake, alert, oriented to person, place, and time.  Pupils equal round and reactive to light.  Facial tone is symmetric.     Strength:         Side Iliopsoas Quads Hamstring PF DF EHL  R 5 5 5 5 5 5   L 5 5 5 5 5 5    Incisions are well healed.   ROS (Neurologic):  Negative except as noted above  IMAGING: Lumbar xrays dated 11/07/22:  No complications noted.  Radiology report for above xrays not yet available.   ASSESSMENT/PLAN:  Johnathan Potts is doing well s/p above surgery. Treatment options reviewed with patient and following plan made:   - He can slowly return to his activities as tolerated.  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  - Follow up as scheduled in 6 months and prn. Will need xrays at that visit.   BP was elevated. No symptoms of chest pain, shortness of breath, blurry vision, or headaches. He is checking at home and working closely with PCP. He has f/u in November and is sending him his BP readings. If he develops CP, SOB, blurry vision, or headaches, then he will go to ED.     Advised to contact the office if any questions or concerns arise.  Johnathan Leach PA-C Department of neurosurgery

## 2022-11-06 ENCOUNTER — Other Ambulatory Visit: Payer: Self-pay | Admitting: Orthopedic Surgery

## 2022-11-06 ENCOUNTER — Other Ambulatory Visit: Payer: Self-pay | Admitting: Neurosurgery

## 2022-11-06 DIAGNOSIS — Z981 Arthrodesis status: Secondary | ICD-10-CM

## 2022-11-06 DIAGNOSIS — M5416 Radiculopathy, lumbar region: Secondary | ICD-10-CM

## 2022-11-07 ENCOUNTER — Ambulatory Visit
Admission: RE | Admit: 2022-11-07 | Discharge: 2022-11-07 | Disposition: A | Discharge status: Home / Self Care | Payer: BC Managed Care – PPO | Attending: Orthopedic Surgery | Admitting: Orthopedic Surgery

## 2022-11-07 ENCOUNTER — Ambulatory Visit
Admission: RE | Admit: 2022-11-07 | Discharge: 2022-11-07 | Disposition: A | Discharge status: Home / Self Care | Payer: BC Managed Care – PPO | Source: Ambulatory Visit | Attending: Orthopedic Surgery | Admitting: Orthopedic Surgery

## 2022-11-07 DIAGNOSIS — M5416 Radiculopathy, lumbar region: Secondary | ICD-10-CM | POA: Diagnosis present

## 2022-11-07 DIAGNOSIS — Z981 Arthrodesis status: Secondary | ICD-10-CM

## 2022-11-07 NOTE — Telephone Encounter (Signed)
DOS: 08/17/22  L4-5 XLIF PSF with Left L5/S1 microdiscectomy   Refill for neurontin okay. Refill for celebrex okay, but will change to 200mg  daily.   Please let him know.

## 2022-11-07 NOTE — Telephone Encounter (Signed)
Patient notified and expressed understanding.

## 2022-11-08 ENCOUNTER — Ambulatory Visit (INDEPENDENT_AMBULATORY_CARE_PROVIDER_SITE_OTHER): Payer: BC Managed Care – PPO | Admitting: Orthopedic Surgery

## 2022-11-08 ENCOUNTER — Encounter: Payer: Self-pay | Admitting: Orthopedic Surgery

## 2022-11-08 VITALS — BP 162/84 | Ht 65.0 in | Wt 166.0 lb

## 2022-11-08 DIAGNOSIS — M5416 Radiculopathy, lumbar region: Secondary | ICD-10-CM

## 2022-11-08 DIAGNOSIS — Z981 Arthrodesis status: Secondary | ICD-10-CM

## 2022-11-08 DIAGNOSIS — Z09 Encounter for follow-up examination after completed treatment for conditions other than malignant neoplasm: Secondary | ICD-10-CM

## 2022-11-08 NOTE — Patient Instructions (Signed)
Your blood pressure was elevated today. I want you to recheck it at home and follow up with your PCP if it remains high. If you have any chest pain, shortness of breath, blurry vision, or headaches then you need to go to ED.    Let me know if you need anything else.   Kennyth Arnold

## 2022-11-28 ENCOUNTER — Encounter: Payer: Self-pay | Admitting: Orthopedic Surgery

## 2022-11-28 NOTE — Telephone Encounter (Signed)
Lumbar xrays dated 11/07/22:  FINDINGS: There are 5 non-rib-bearing lumbar-type vertebral bodies. Minimal dextrocurvature centered at T12-L1. There is mild dextrocurvature centered at T12-L1 with mild left T12-L1 disc space narrowing, similar to prior.   Postsurgical changes are again seen of bilateral transpedicular rod and screw fusion at L4-5 with associated intervertebral disc spacer. Moderate anterior L4-5 endplate osteophytes are unchanged from prior.   Mild 2 mm retrolisthesis of L5 on S1, unchanged from prior.   Vertebral body heights are maintained.   Mild posterior L5-S1 disc space narrowing. Mild anterior L5-S1 endplate osteophytosis.   There is again an 11 mm calcific density overlying the right upper quadrant of the abdomen. Again this may represent a renal stone. A similar density overlying the stomach was not seen previously and likely represents food contents.   IMPRESSION: 1. Postsurgical changes of bilateral transpedicular rod and screw fusion at L4-5 without evidence of hardware complication. 2. Mild degenerative disc and endplate changes at T12-L1 and L5-S1, similar to prior.     Electronically Signed   By: Neita Garnet M.D.   On: 11/28/2022 16:34  I have personally reviewed the images and agree with the above interpretation.

## 2022-12-09 ENCOUNTER — Encounter: Payer: Self-pay | Admitting: Emergency Medicine

## 2022-12-09 ENCOUNTER — Other Ambulatory Visit: Payer: Self-pay | Admitting: Emergency Medicine

## 2022-12-09 DIAGNOSIS — Z122 Encounter for screening for malignant neoplasm of respiratory organs: Secondary | ICD-10-CM

## 2022-12-09 DIAGNOSIS — Z87891 Personal history of nicotine dependence: Secondary | ICD-10-CM

## 2022-12-14 ENCOUNTER — Ambulatory Visit (INDEPENDENT_AMBULATORY_CARE_PROVIDER_SITE_OTHER): Payer: BC Managed Care – PPO | Admitting: Adult Health

## 2022-12-14 ENCOUNTER — Encounter: Payer: Self-pay | Admitting: Adult Health

## 2022-12-14 DIAGNOSIS — Z87891 Personal history of nicotine dependence: Secondary | ICD-10-CM

## 2022-12-14 NOTE — Progress Notes (Signed)
  Virtual Visit via Telephone Note  I connected with Johnathan Potts , 12/14/22 8:36 AM by a telemedicine application and verified that I am speaking with the correct person using two identifiers.  Location: Patient: home Provider: home   I discussed the limitations of evaluation and management by telemedicine and the availability of in person appointments. The patient expressed understanding and agreed to proceed.   Shared Decision Making Visit Lung Cancer Screening Program [phone])   Eligibility: 52 y.o. Pack Years Smoking History Calculation = 43 pack years (# packs/per year x # years smoked) Recent History of coughing up blood  no Unexplained weight loss? no ( >Than 15 pounds within the last 6 months ) Prior History Lung / other cancer no (Diagnosis within the last 5 years already requiring surveillance chest CT Scans). Smoking Status Former Smoker Former Smokers: Years since quit: < 1 year  Quit Date: 07/2022  Visit Components: Discussion included one or more decision making aids. YES Discussion included risk/benefits of screening. YES Discussion included potential follow up diagnostic testing for abnormal scans. YES Discussion included meaning and risk of over diagnosis. YES Discussion included meaning and risk of False Positives. YES Discussion included meaning of total radiation exposure. YES  Counseling Included: Importance of adherence to annual lung cancer LDCT screening. YES Impact of comorbidities on ability to participate in the program. YES Ability and willingness to under diagnostic treatment. YES  Smoking Cessation Counseling: Former Smokers:  Discussed the importance of maintaining cigarette abstinence. yes Diagnosis Code: Personal History of Nicotine Dependence. Q46.962 Information about tobacco cessation classes and interventions provided to patient. Yes Patient provided with "ticket" for LDCT Scan. yes Written Order for Lung Cancer Screening with LDCT  placed in Epic. Yes (CT Chest Lung Cancer Screening Low Dose W/O CM) [id number]  Z12.2-Screening of respiratory organs Z87.891-Personal history of nicotine dependence   [name] 12/14/22

## 2022-12-14 NOTE — Patient Instructions (Signed)

## 2022-12-16 ENCOUNTER — Ambulatory Visit
Admission: RE | Admit: 2022-12-16 | Discharge: 2022-12-16 | Disposition: A | Discharge status: Home / Self Care | Payer: BC Managed Care – PPO | Source: Ambulatory Visit | Attending: Acute Care | Admitting: Acute Care

## 2022-12-16 DIAGNOSIS — Z87891 Personal history of nicotine dependence: Secondary | ICD-10-CM | POA: Diagnosis present

## 2022-12-16 DIAGNOSIS — Z122 Encounter for screening for malignant neoplasm of respiratory organs: Secondary | ICD-10-CM | POA: Insufficient documentation

## 2023-01-10 ENCOUNTER — Other Ambulatory Visit: Payer: Self-pay | Admitting: Acute Care

## 2023-01-10 DIAGNOSIS — Z122 Encounter for screening for malignant neoplasm of respiratory organs: Secondary | ICD-10-CM

## 2023-01-10 DIAGNOSIS — Z87891 Personal history of nicotine dependence: Secondary | ICD-10-CM

## 2023-05-05 ENCOUNTER — Other Ambulatory Visit: Payer: Self-pay | Admitting: Family Medicine

## 2023-05-05 DIAGNOSIS — M5416 Radiculopathy, lumbar region: Secondary | ICD-10-CM

## 2023-05-08 ENCOUNTER — Ambulatory Visit
Admission: RE | Admit: 2023-05-08 | Discharge: 2023-05-08 | Disposition: A | Discharge status: Home / Self Care | Attending: Neurosurgery | Admitting: Neurosurgery

## 2023-05-08 ENCOUNTER — Ambulatory Visit
Admission: RE | Admit: 2023-05-08 | Discharge: 2023-05-08 | Disposition: A | Discharge status: Home / Self Care | Source: Ambulatory Visit | Attending: Neurosurgery | Admitting: Neurosurgery

## 2023-05-08 DIAGNOSIS — M5416 Radiculopathy, lumbar region: Secondary | ICD-10-CM | POA: Diagnosis present

## 2023-05-09 ENCOUNTER — Ambulatory Visit: Payer: BC Managed Care – PPO | Admitting: Neurosurgery

## 2023-05-09 VITALS — BP 136/84 | Ht 65.0 in | Wt 179.0 lb

## 2023-05-09 DIAGNOSIS — Z981 Arthrodesis status: Secondary | ICD-10-CM | POA: Diagnosis not present

## 2023-05-09 DIAGNOSIS — M5416 Radiculopathy, lumbar region: Secondary | ICD-10-CM | POA: Diagnosis not present

## 2023-05-09 NOTE — Progress Notes (Signed)
   REFERRING PHYSICIAN:  Dorothey Baseman, Md [address] [place],  [place] [address]  DOS: 08/17/22  L4-5 XLIF PSF with Left L5/S1 microdiscectomy   HISTORY OF PRESENT ILLNESS: Johnathan Potts is doing well after surgery.  He has minimal pain.  He has had some expected aches and pains, but is overall doing extremely well.  He is very happy.   PHYSICAL EXAMINATION:  General: Patient is well developed, well nourished, calm, collected, and in no apparent distress.   NEUROLOGICAL:  General: In no acute distress.   Awake, alert, oriented to person, place, and time.  Pupils equal round and reactive to light.  Facial tone is symmetric.     Strength:         Side Iliopsoas Quads Hamstring PF DF EHL  R 5 5 5 5 5 5   L 5 5 5 5 5 5    Incisions are well healed.   ROS (Neurologic):  Negative except as noted above  IMAGING: Lumbar xrays dated 11/07/22:  No complications noted.  Radiology report for above xrays not yet available.   X-rays from yesterday are stable.  ASSESSMENT/PLAN:  Johnathan Potts is doing well s/p above surgery.  I am very pleased with his response to surgery.  I will see him back as needed.  Will be happy to see him at any point in the future if he has additional issues.  We have reviewed his activity limitations.  I am very pleased that he is continue to stay away from nicotine products.    I spent a total of 10 minutes in this patient's care today. This time was spent reviewing pertinent records including imaging studies, obtaining and confirming history, performing a directed evaluation, formulating and discussing my recommendations, and documenting the visit within the medical record.    Venetia Night MD Department of neurosurgery

## 2023-12-18 ENCOUNTER — Ambulatory Visit
Admission: RE | Admit: 2023-12-18 | Discharge: 2023-12-18 | Disposition: A | Discharge status: Home / Self Care | Source: Ambulatory Visit | Attending: Acute Care | Admitting: Acute Care

## 2023-12-18 DIAGNOSIS — Z122 Encounter for screening for malignant neoplasm of respiratory organs: Secondary | ICD-10-CM | POA: Insufficient documentation

## 2023-12-18 DIAGNOSIS — Z87891 Personal history of nicotine dependence: Secondary | ICD-10-CM | POA: Diagnosis present

## 2023-12-25 ENCOUNTER — Other Ambulatory Visit: Payer: Self-pay | Admitting: Acute Care

## 2023-12-25 DIAGNOSIS — Z122 Encounter for screening for malignant neoplasm of respiratory organs: Secondary | ICD-10-CM

## 2023-12-25 DIAGNOSIS — Z87891 Personal history of nicotine dependence: Secondary | ICD-10-CM

## 2024-02-08 ENCOUNTER — Other Ambulatory Visit: Payer: Self-pay | Admitting: Nephrology

## 2024-02-08 DIAGNOSIS — E1129 Type 2 diabetes mellitus with other diabetic kidney complication: Secondary | ICD-10-CM

## 2024-02-08 DIAGNOSIS — N200 Calculus of kidney: Secondary | ICD-10-CM

## 2024-02-08 DIAGNOSIS — R8290 Unspecified abnormal findings in urine: Secondary | ICD-10-CM

## 2024-02-08 DIAGNOSIS — R809 Proteinuria, unspecified: Secondary | ICD-10-CM

## 2024-02-12 ENCOUNTER — Ambulatory Visit
Admission: RE | Admit: 2024-02-12 | Discharge: 2024-02-12 | Disposition: A | Discharge status: Home / Self Care | Source: Ambulatory Visit | Attending: Nephrology | Admitting: Nephrology

## 2024-02-12 DIAGNOSIS — R8290 Unspecified abnormal findings in urine: Secondary | ICD-10-CM | POA: Diagnosis present

## 2024-02-12 DIAGNOSIS — N200 Calculus of kidney: Secondary | ICD-10-CM | POA: Diagnosis present

## 2024-02-12 DIAGNOSIS — E1129 Type 2 diabetes mellitus with other diabetic kidney complication: Secondary | ICD-10-CM | POA: Diagnosis present

## 2024-02-12 DIAGNOSIS — R809 Proteinuria, unspecified: Secondary | ICD-10-CM | POA: Diagnosis present
